# Patient Record
Sex: Female | Born: 1995 | Race: Asian | Hispanic: No | Marital: Single
Health system: Southern US, Community
[De-identification: ages and names within clinical notes are randomized; demographics above are authoritative.]

## PROBLEM LIST (undated history)

## (undated) DIAGNOSIS — R519 Headache, unspecified: Secondary | ICD-10-CM

## (undated) DIAGNOSIS — J45909 Unspecified asthma, uncomplicated: Secondary | ICD-10-CM

## (undated) HISTORY — PX: NO PAST SURGERIES: SHX2092

---
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
## 2017-01-03 ENCOUNTER — Other Ambulatory Visit: Payer: Self-pay

## 2017-06-06 LAB — OB RESULTS CONSOLE GC/CHLAMYDIA
Chlamydia: NEGATIVE
Gonorrhea: NEGATIVE

## 2017-06-06 LAB — OB RESULTS CONSOLE GBS: STREP GROUP B AG: NEGATIVE

## 2017-07-10 ENCOUNTER — Telehealth (HOSPITAL_COMMUNITY): Payer: Self-pay | Admitting: *Deleted

## 2017-07-10 NOTE — Telephone Encounter (Signed)
Preadmission screen  

## 2017-07-12 ENCOUNTER — Encounter (HOSPITAL_COMMUNITY): Payer: Self-pay | Admitting: *Deleted

## 2017-07-12 ENCOUNTER — Inpatient Hospital Stay (HOSPITAL_COMMUNITY)
Admission: AD | Admit: 2017-07-12 | Discharge: 2017-07-12 | Disposition: A | Discharge status: Home / Self Care | Payer: Medicaid Other | Source: Ambulatory Visit | Attending: Obstetrics & Gynecology | Admitting: Obstetrics & Gynecology

## 2017-07-12 ENCOUNTER — Inpatient Hospital Stay (HOSPITAL_COMMUNITY): Payer: Medicaid Other

## 2017-07-12 DIAGNOSIS — O368190 Decreased fetal movements, unspecified trimester, not applicable or unspecified: Secondary | ICD-10-CM

## 2017-07-12 DIAGNOSIS — Z3A41 41 weeks gestation of pregnancy: Secondary | ICD-10-CM

## 2017-07-12 DIAGNOSIS — O480 Post-term pregnancy: Secondary | ICD-10-CM

## 2017-07-12 DIAGNOSIS — O368130 Decreased fetal movements, third trimester, not applicable or unspecified: Secondary | ICD-10-CM

## 2017-07-12 NOTE — MAU Provider Note (Signed)
First Provider Initiated Contact with Patient 07/12/17 2056      Chief Complaint:  Decreased Fetal Movement   Dana Oneal is  21 y.o. G1P0 at 3980w2d presents complaining of crease fetal movement.  Patient states that after her appointment earlier today she no longer felt fetal movement.  States that between 5 PM and 7 PM she monitored kick couns and did not feel any kicks.  States that now she is in the MAU she feels movement.  She states she is having irregular contractions, no vaginal bleeding, no vaginal discharge. Denies leakage of fluid.    Receives prenatal care through the health department  Obstetrical/Gynecological History: OB History    Gravida Para Term Preterm AB Living   1         0   SAB TAB Ectopic Multiple Live Births                 Past Medical History: Past Medical History:  Diagnosis Date  . Asthma     Past Surgical History: Past Surgical History:  Procedure Laterality Date  . NO PAST SURGERIES      Family History: History reviewed. No pertinent family history.  Social History: Social History   Tobacco Use  . Smoking status: Passive Smoke Exposure - Never Smoker  . Smokeless tobacco: Never Used  Substance Use Topics  . Alcohol use: No  . Drug use: No    Allergies: No Known Allergies  Meds:  Medications Prior to Admission  Medication Sig Dispense Refill Last Dose  . ibuprofen (ADVIL,MOTRIN) 400 MG tablet Take 1 tablet (400 mg total) by mouth every 6 (six) hours as needed. (Patient not taking: Reported on 06/29/2016) 30 tablet 0 Not Taking  . Prenatal Vit w/Fe-Methylfol-FA (PNV PO) Take by mouth.       Review of Systems   Constitutional: Negative for fever and chills Eyes: Negative for visual disturbances Respiratory: Negative for shortness of breath, dyspnea Cardiovascular: Negative for chest pain or palpitations  Gastrointestinal: Negative for vomiting, diarrhea and constipation Genitourinary: Negative for dysuria and  urgency Musculoskeletal: Negative for back pain, joint pain, myalgias.  Normal ROM  Neurological: Negative for dizziness and headaches  Physical Exam  Blood pressure 129/78, pulse 77, temperature 97.9 F (36.6 C), resp. rate 18, last menstrual period 09/26/2016. GENERAL: Well-developed, well-nourished female in no acute distress.  LUNGS: Clear to auscultation bilaterally.  HEART: Regular rate and rhythm. ABDOMEN: Soft, nontender, nondistended, gravid.  EXTREMITIES: Nontender, no edema, 2+ distal pulses. DTR's 2+    Labs: No results found for this or any previous visit (from the past 24 hour(s)). Imaging Studies:  BPP 8/8.    I personally reviewed the patient's NST today, found to be REACTIVE. 130 bpm, mod var, +accels, no decels. CTX: irregular  Dr. Doroteo GlassmanPhelps in a CS now, so BPP reviewed and pt given results .Baby active now  Assessment: Dana Oneal is  21 y.o. G1P0 presents with decreased FM, resolved. BPP 8/8 and reviewed with patient.   Plan: DC home ins table condition  CRESENZO-DISHMAN,FRANCES 11/15/201810:54 PM  Caryl AdaJazma Phelps, DO OB Fellow

## 2017-07-12 NOTE — MAU Note (Signed)
Pt reprots she has not felt baby move since earlier this morning. Also c/o tightening on and off in her stomach.

## 2017-07-12 NOTE — Discharge Instructions (Signed)

## 2017-07-13 ENCOUNTER — Encounter (HOSPITAL_COMMUNITY): Payer: Self-pay

## 2017-07-13 ENCOUNTER — Inpatient Hospital Stay (HOSPITAL_COMMUNITY)
Admit: 2017-07-13 | Discharge: 2017-07-17 | DRG: 807 | Disposition: A | Discharge status: Home / Self Care | Payer: Medicaid Other | Source: Ambulatory Visit | Attending: Obstetrics and Gynecology | Admitting: Obstetrics and Gynecology

## 2017-07-13 ENCOUNTER — Telehealth: Payer: Self-pay | Admitting: Advanced Practice Midwife

## 2017-07-13 ENCOUNTER — Telehealth (HOSPITAL_COMMUNITY): Payer: Self-pay | Admitting: General Practice

## 2017-07-13 DIAGNOSIS — R339 Retention of urine, unspecified: Secondary | ICD-10-CM | POA: Diagnosis not present

## 2017-07-13 DIAGNOSIS — O480 Post-term pregnancy: Secondary | ICD-10-CM | POA: Diagnosis present

## 2017-07-13 DIAGNOSIS — Z3A41 41 weeks gestation of pregnancy: Secondary | ICD-10-CM

## 2017-07-13 DIAGNOSIS — Z7722 Contact with and (suspected) exposure to environmental tobacco smoke (acute) (chronic): Secondary | ICD-10-CM | POA: Diagnosis present

## 2017-07-13 DIAGNOSIS — Z3490 Encounter for supervision of normal pregnancy, unspecified, unspecified trimester: Secondary | ICD-10-CM | POA: Diagnosis present

## 2017-07-13 MED ORDER — SOD CITRATE-CITRIC ACID 500-334 MG/5ML PO SOLN: 30.0000 mL | ORAL | Status: DC | PRN

## 2017-07-13 MED ORDER — LACTATED RINGERS IV SOLN
INTRAVENOUS | Status: DC
  Administered 2017-07-13 – 2017-07-14 (×2): via INTRAVENOUS

## 2017-07-13 MED ORDER — OXYTOCIN 40 UNITS IN LACTATED RINGERS INFUSION - SIMPLE MED
2.5000 [IU]/h | INTRAVENOUS | Status: DC
  Filled 2017-07-13: qty 1000

## 2017-07-13 MED ORDER — OXYCODONE-ACETAMINOPHEN 5-325 MG PO TABS: 1.0000 | ORAL_TABLET | ORAL | Status: DC | PRN

## 2017-07-13 MED ORDER — FLEET ENEMA 7-19 GM/118ML RE ENEM: 1.0000 | ENEMA | RECTAL | Status: DC | PRN

## 2017-07-13 MED ORDER — OXYTOCIN BOLUS FROM INFUSION
500.0000 mL | Freq: Once | INTRAVENOUS | Status: AC
  Administered 2017-07-14: 500 mL via INTRAVENOUS

## 2017-07-13 MED ORDER — TERBUTALINE SULFATE 1 MG/ML IJ SOLN
0.2500 mg | Freq: Once | INTRAMUSCULAR | Status: DC | PRN
  Filled 2017-07-13: qty 1

## 2017-07-13 MED ORDER — OXYCODONE-ACETAMINOPHEN 5-325 MG PO TABS: 2.0000 | ORAL_TABLET | ORAL | Status: DC | PRN

## 2017-07-13 MED ORDER — LACTATED RINGERS IV SOLN: 500.0000 mL | INTRAVENOUS | Status: DC | PRN

## 2017-07-13 MED ORDER — LIDOCAINE HCL (PF) 1 % IJ SOLN
30.0000 mL | INTRAMUSCULAR | Status: DC | PRN
  Filled 2017-07-13: qty 30

## 2017-07-13 MED ORDER — MISOPROSTOL 25 MCG QUARTER TABLET
25.0000 ug | ORAL_TABLET | ORAL | Status: DC | PRN
  Administered 2017-07-13: 25 ug via VAGINAL
  Filled 2017-07-13 (×2): qty 1

## 2017-07-13 MED ORDER — ONDANSETRON HCL 4 MG/2ML IJ SOLN
4.0000 mg | Freq: Four times a day (QID) | INTRAMUSCULAR | Status: DC | PRN
  Administered 2017-07-14: 4 mg via INTRAVENOUS
  Filled 2017-07-13: qty 2

## 2017-07-13 MED ORDER — ACETAMINOPHEN 325 MG PO TABS: 650.0000 mg | ORAL_TABLET | ORAL | Status: DC | PRN

## 2017-07-13 NOTE — Telephone Encounter (Signed)
Pt was discharged from MAU yesterday but on chart review, Dr Macon LargeAnyanwu recommends IOL for postdates. Called pt and left message on voicemail for pt to return call or [facility].  IOL added on tonight for [facility] at midnight.

## 2017-07-13 NOTE — H&P (Signed)
LABOR AND DELIVERY ADMISSION HISTORY AND PHYSICAL NOTE  Dana Oneal is a 21 y.o. female G1P0 with IUP at 7257w3d by 1st trimester US presenting for IOL for post date.   She reports positive fetal movement. She denies leakage of fluid or vaginal bleeding.  Pren[facility] History/Complications[facility] Pregnancy complications: Anemia, varicella non immune  Past Medical History: Past Medical History:  Diagnosis Date  . Asthma    last inhaler use "in middle school"    Past Surgical History: Past Surgical History:  Procedure Laterality Date  . NO PAST SURGERIES      Obstetrical History: OB History    Gravida Para Term Preterm AB Living   1         0   SAB TAB Ectopic Multiple Live Births                  Social History: Social History   Socioeconomic History  . Marital status: Single    Spouse name: None  . Number of children: None  . Years of education: None  . Highest education level: None  Social Needs  . Financial resource strain: None  . Food insecurity - worry: None  . Food insecurity - inability: None  . Transportation needs - medical: None  . Transportation needs - non-medical: None  Occupational History  . None  Tobacco Use  . Smoking status: Passive Smoke Exposure - Never Smoker  . Smokeless tobacco: Never Used  Substance and Sexual Activity  . Alcohol use: No  . Drug use: No  . Sexual activity: Yes    Birth control/protection: None  Other Topics Concern  . None  Social History Narrative  . None    Family History: History reviewed. No pertinent family history.  Allergies: No Known Allergies  Medications Prior to Admission  Medication Sig Dispense Refill Last Dose  . Prenatal Vit w/Fe-Methylfol-FA (PNV PO) Take by mouth.   07/12/2017 at Unknown time     Review of Systems  All systems reviewed and negative except as stated in HPI  Physical Exam Blood pressure 124/81, pulse (!) 115, temperature 97.7 F (36.5 C), temperature source Oral,  resp. rate 18, height 5' 1.5" (1.562 m), weight 146 lb (66.2 kg), last menstrual period 09/26/2016. General appearance: alert, cooperative and no distress Lungs: clear to auscultation bilaterally Heart: regular rate and rhythm Abdomen: soft, non-tender; bowel sounds normal Extremities: No calf swelling or tenderness Presentation: cephalic on exam  Fetal monitoring: HR 140, mod variability, + accels, no decels  Uterine activity: Not tracing contractions  SVE: 1.5cm/30%/-3  Prenatal labs: ABO, Rh: O/Positive/-- (05/03 0000) Antibody: Negative (05/03 0000) Rubella: Immune (05/03 0000) RPR: Nonreactive (05/03 0000)  HBsAg: Negative (05/03 0000)  HIV: Non-reactive (05/03 0000)  GBS: Negative (10/10 0000)  1 hr Glucola: 106 Genetic screening:  Negative Anatomy US: Normal anatomy per prenatal record  Prenatal Transfer Tool  Maternal Diabetes: No Genetic Screening: Normal Maternal Ultrasounds/Referrals: Normal Fetal Ultrasounds or other Referrals:  None Maternal Substance Abuse:  No Significant Maternal Medications:  None Significant Maternal Lab Results: Lab values include: Group B Strep negative, Other: Varicella non immune  No results found for this or any previous visit (from the past 24 hour(s)).  Patient Active Problem List   Diagnosis Date Noted  . Encounter for induction of labor 07/13/2017    Assessment: Dana Oneal is a 21 y.o. G1P0 at 6257w3d here for IOL for post date.  #Labor: Cytotec and Foley bulb placed @ 12:00 #  Pain: Likely IV pain meds, epidural per patient #FWB: Cat 1 #ID:  GBS neg  #MOF: Both #MOC: No birth control #Circ:  N/A  Abdoulaye Diallo 07/13/2017, 11:20 PM   OB FELLOW HISTORY AND PHYSICAL ATTESTATION I have seen and examined this patient; I agree with above documentation in the resident's note.  --PDIOL. Start cervical ripening with Cytotec and FB --GBS neg --FHT Cat I   Frederik PearJulie P Degele, MD OB Fellow 07/14/2017, 1:59 AM

## 2017-07-14 ENCOUNTER — Encounter (HOSPITAL_COMMUNITY): Payer: Self-pay | Admitting: Anesthesiology

## 2017-07-14 ENCOUNTER — Inpatient Hospital Stay (HOSPITAL_COMMUNITY): Payer: Medicaid Other | Admitting: Anesthesiology

## 2017-07-14 ENCOUNTER — Encounter (HOSPITAL_COMMUNITY): Payer: Self-pay

## 2017-07-14 DIAGNOSIS — O480 Post-term pregnancy: Secondary | ICD-10-CM

## 2017-07-14 DIAGNOSIS — Z3A41 41 weeks gestation of pregnancy: Secondary | ICD-10-CM

## 2017-07-14 LAB — TYPE AND SCREEN
ABO/RH(D): O POS
Antibody Screen: NEGATIVE

## 2017-07-14 LAB — CBC
HEMATOCRIT: 29.5 % — AB (ref 36.0–46.0)
HEMOGLOBIN: 9.5 g/dL — AB (ref 12.0–15.0)
MCH: 23.7 pg — ABNORMAL LOW (ref 26.0–34.0)
MCHC: 32.2 g/dL (ref 30.0–36.0)
MCV: 73.6 fL — AB (ref 78.0–100.0)
Platelets: 224 10*3/uL (ref 150–400)
RBC: 4.01 MIL/uL (ref 3.87–5.11)
RDW: 15.9 % — ABNORMAL HIGH (ref 11.5–15.5)
WBC: 8.4 10*3/uL (ref 4.0–10.5)

## 2017-07-14 LAB — RPR: RPR Ser Ql: NONREACTIVE

## 2017-07-14 LAB — ABO/RH: ABO/RH(D): O POS

## 2017-07-14 MED ORDER — SIMETHICONE 80 MG PO CHEW: 80.0000 mg | CHEWABLE_TABLET | ORAL | Status: DC | PRN

## 2017-07-14 MED ORDER — EPHEDRINE 5 MG/ML INJ
10.0000 mg | INTRAVENOUS | Status: DC | PRN
  Filled 2017-07-14: qty 2

## 2017-07-14 MED ORDER — IBUPROFEN 600 MG PO TABS
600.0000 mg | ORAL_TABLET | Freq: Four times a day (QID) | ORAL | Status: DC
  Administered 2017-07-14 – 2017-07-17 (×10): 600 mg via ORAL
  Filled 2017-07-14 (×10): qty 1

## 2017-07-14 MED ORDER — PHENYLEPHRINE 40 MCG/ML (10ML) SYRINGE FOR IV PUSH (FOR BLOOD PRESSURE SUPPORT)
80.0000 ug | PREFILLED_SYRINGE | INTRAVENOUS | Status: DC | PRN
  Filled 2017-07-14: qty 5

## 2017-07-14 MED ORDER — COCONUT OIL OIL: 1.0000 "application " | TOPICAL_OIL | Status: DC | PRN

## 2017-07-14 MED ORDER — ZOLPIDEM TARTRATE 5 MG PO TABS: 5.0000 mg | ORAL_TABLET | Freq: Every evening | ORAL | Status: DC | PRN

## 2017-07-14 MED ORDER — DIBUCAINE 1 % RE OINT: 1.0000 "application " | TOPICAL_OINTMENT | RECTAL | Status: DC | PRN

## 2017-07-14 MED ORDER — BENZOCAINE-MENTHOL 20-0.5 % EX AERO
1.0000 "application " | INHALATION_SPRAY | CUTANEOUS | Status: DC | PRN
  Administered 2017-07-14 – 2017-07-17 (×2): 1 via TOPICAL
  Filled 2017-07-14 (×2): qty 56

## 2017-07-14 MED ORDER — ACETAMINOPHEN 325 MG PO TABS
650.0000 mg | ORAL_TABLET | ORAL | Status: DC | PRN
  Administered 2017-07-14 – 2017-07-15 (×2): 650 mg via ORAL
  Filled 2017-07-14 (×2): qty 2

## 2017-07-14 MED ORDER — DIPHENHYDRAMINE HCL 25 MG PO CAPS: 25.0000 mg | ORAL_CAPSULE | Freq: Four times a day (QID) | ORAL | Status: DC | PRN

## 2017-07-14 MED ORDER — LIDOCAINE HCL (PF) 1 % IJ SOLN
INTRAMUSCULAR | Status: DC | PRN
  Administered 2017-07-14: 4 mL via EPIDURAL

## 2017-07-14 MED ORDER — PHENYLEPHRINE 40 MCG/ML (10ML) SYRINGE FOR IV PUSH (FOR BLOOD PRESSURE SUPPORT)
PREFILLED_SYRINGE | INTRAVENOUS | Status: AC
  Filled 2017-07-14: qty 10

## 2017-07-14 MED ORDER — LACTATED RINGERS IV SOLN
500.0000 mL | Freq: Once | INTRAVENOUS | Status: AC
  Administered 2017-07-14: 500 mL via INTRAVENOUS

## 2017-07-14 MED ORDER — PRENATAL MULTIVITAMIN CH
1.0000 | ORAL_TABLET | Freq: Every day | ORAL | Status: DC
  Administered 2017-07-15 – 2017-07-16 (×2): 1 via ORAL
  Filled 2017-07-14 (×2): qty 1

## 2017-07-14 MED ORDER — ONDANSETRON HCL 4 MG PO TABS: 4.0000 mg | ORAL_TABLET | ORAL | Status: DC | PRN

## 2017-07-14 MED ORDER — FENTANYL 2.5 MCG/ML BUPIVACAINE 1/10 % EPIDURAL INFUSION (WH - ANES)
14.0000 mL/h | INTRAMUSCULAR | Status: DC | PRN
  Administered 2017-07-14: 12 mL/h via EPIDURAL
  Administered 2017-07-14: 14 mL/h via EPIDURAL
  Filled 2017-07-14: qty 100

## 2017-07-14 MED ORDER — TETANUS-DIPHTH-ACELL PERTUSSIS 5-2.5-18.5 LF-MCG/0.5 IM SUSP: 0.5000 mL | Freq: Once | INTRAMUSCULAR | Status: DC

## 2017-07-14 MED ORDER — ONDANSETRON HCL 4 MG/2ML IJ SOLN: 4.0000 mg | INTRAMUSCULAR | Status: DC | PRN

## 2017-07-14 MED ORDER — FENTANYL CITRATE (PF) 100 MCG/2ML IJ SOLN
100.0000 ug | INTRAMUSCULAR | Status: DC | PRN
  Administered 2017-07-14 (×2): 100 ug via INTRAVENOUS
  Filled 2017-07-14 (×2): qty 2

## 2017-07-14 MED ORDER — TERBUTALINE SULFATE 1 MG/ML IJ SOLN
0.2500 mg | Freq: Once | INTRAMUSCULAR | Status: DC | PRN
  Filled 2017-07-14: qty 1

## 2017-07-14 MED ORDER — LACTATED RINGERS IV SOLN: 500.0000 mL | Freq: Once | INTRAVENOUS | Status: DC

## 2017-07-14 MED ORDER — OXYTOCIN 40 UNITS IN LACTATED RINGERS INFUSION - SIMPLE MED
1.0000 m[IU]/min | INTRAVENOUS | Status: DC
  Administered 2017-07-14: 2 m[IU]/min via INTRAVENOUS

## 2017-07-14 MED ORDER — DIPHENHYDRAMINE HCL 50 MG/ML IJ SOLN: 12.5000 mg | INTRAMUSCULAR | Status: DC | PRN

## 2017-07-14 MED ORDER — WITCH HAZEL-GLYCERIN EX PADS: 1.0000 "application " | MEDICATED_PAD | CUTANEOUS | Status: DC | PRN

## 2017-07-14 MED ORDER — FENTANYL 2.5 MCG/ML BUPIVACAINE 1/10 % EPIDURAL INFUSION (WH - ANES)
INTRAMUSCULAR | Status: AC
  Filled 2017-07-14: qty 100

## 2017-07-14 MED ORDER — SENNOSIDES-DOCUSATE SODIUM 8.6-50 MG PO TABS
2.0000 | ORAL_TABLET | ORAL | Status: DC
  Administered 2017-07-14 – 2017-07-16 (×3): 2 via ORAL
  Filled 2017-07-14 (×3): qty 2

## 2017-07-14 NOTE — Anesthesia Preprocedure Evaluation (Addendum)
Anesthesia Evaluation  Patient identified by MRN, date of birth, ID band Patient awake    Reviewed: Allergy & Precautions, Patient's Chart, lab work & pertinent test results  Airway Mallampati: I  TM Distance: >3 FB Neck ROM: Full    Dental   Pulmonary asthma ,    breath sounds clear to auscultation       Cardiovascular negative cardio ROS   Rhythm:Regular Rate:Normal     Neuro/Psych negative neurological ROS     GI/Hepatic negative GI ROS, Neg liver ROS,   Endo/Other  negative endocrine ROS  Renal/GU negative Renal ROS     Musculoskeletal negative musculoskeletal ROS (+)   Abdominal   Peds  Hematology negative hematology ROS (+)   Anesthesia Other Findings Day of surgery medications reviewed with the patient.  Reproductive/Obstetrics (+) Pregnancy                             Lab Results  Component Value Date   WBC 8.4 07/13/2017   HGB 9.5 (L) 07/13/2017   HCT 29.5 (L) 07/13/2017   MCV 73.6 (L) 07/13/2017   PLT 224 07/13/2017     Anesthesia Physical Anesthesia Plan  ASA: II  Anesthesia Plan: Epidural   Post-op Pain Management:    Induction:   PONV Risk Score and Plan:   Airway Management Planned:   Additional Equipment:   Intra-op Plan:   Post-operative Plan:   Informed Consent: I have reviewed the patients History and Physical, chart, labs and discussed the procedure including the risks, benefits and alternatives for the proposed anesthesia with the patient or authorized representative who has indicated his/her understanding and acceptance.     Plan Discussed with:   Anesthesia Plan Comments:         Anesthesia Quick Evaluation

## 2017-07-14 NOTE — Progress Notes (Signed)
Dana Oneal is a 21 y.o. G1P0 at 3131w4d.  Subjective: AROM @1630   Objective: BP 131/73   Pulse 75   Temp 98.3 F (36.8 C) (Oral)   Resp 18   Ht 5' 1.5" (1.562 m)   Wt 146 lb (66.2 kg)   LMP 09/26/2016   BMI 27.14 kg/m    FHT:  FHR: 145 bpm, variability: minimal,  accelerations:  n/a,  decelerations: minmal UC:   Q 2-223minutes,  Dilation: 10 Dilation Complete Date: 07/14/17 Dilation Complete Time: 1625 Effacement (%): 100 Station: 0 Presentation: Vertex Exam by:: Mary Early  Labs: Results for orders placed or performed during the hospital encounter of 07/13/17 (from the past 24 hour(s))  CBC     Status: Abnormal   Collection Time: 07/13/17 11:30 PM  Result Value Ref Range   WBC 8.4 4.0 - 10.5 K/uL   RBC 4.01 3.87 - 5.11 MIL/uL   Hemoglobin 9.5 (L) 12.0 - 15.0 g/dL   HCT 82.929.5 (L) 56.236.0 - 13.046.0 %   MCV 73.6 (L) 78.0 - 100.0 fL   MCH 23.7 (L) 26.0 - 34.0 pg   MCHC 32.2 30.0 - 36.0 g/dL   RDW 86.515.9 (H) 78.411.5 - 69.615.5 %   Platelets 224 150 - 400 K/uL  Type and screen [facility]     Status: None   Collection Time: 07/13/17 11:30 PM  Result Value Ref Range   ABO/RH(D) O POS    Antibody Screen NEG    Sample Expiration 07/16/2017   ABO/Rh     Status: None   Collection Time: 07/13/17 11:30 PM  Result Value Ref Range   ABO/RH(D) O POS     Assessment / Plan: 4631w4d week IUP Labor: active Fetal Wellbeing:  Category 1 Pain Control:  Well controlled Anticipated MOD:  SVD  Dana RollingBland, Dana Leiter, DO 07/14/2017 4:35 PM

## 2017-07-14 NOTE — Anesthesia Procedure Notes (Signed)
Epidural Patient location during procedure: OB Start time: 07/14/2017 8:08 AM End time: 07/14/2017 8:14 AM  Staffing Anesthesiologist: Shelton SilvasHollis, Kevin D, MD Performed: anesthesiologist   Preanesthetic Checklist Completed: patient identified, site marked, surgical consent, pre-op evaluation, timeout performed, IV checked, risks and benefits discussed and monitors and equipment checked  Epidural Patient position: sitting Prep: ChloraPrep Patient monitoring: heart rate, continuous pulse ox and blood pressure Approach: midline Location: L3-L4 Injection technique: LOR saline  Needle:  Needle type: Tuohy  Needle gauge: 17 G Needle length: 9 cm Catheter type: closed end flexible Catheter size: 20 Guage Test dose: negative and 1.5% lidocaine  Assessment Events: blood not aspirated, injection not painful, no injection resistance and no paresthesia  Additional Notes LOR @ 4  Patient identified. Risks/Benefits/Options discussed with patient including but not limited to bleeding, infection, nerve damage, paralysis, failed block, incomplete pain control, headache, blood pressure changes, nausea, vomiting, reactions to medications, itching and postpartum back pain. Confirmed with bedside nurse the patient's most recent platelet count. Confirmed with patient that they are not currently taking any anticoagulation, have any bleeding history or any family history of bleeding disorders. Patient expressed understanding and wished to proceed. All questions were answered. Sterile technique was used throughout the entire procedure. Please see nursing notes for vital signs. Test dose was given through epidural catheter and negative prior to continuing to dose epidural or start infusion. Warning signs of high block given to the patient including shortness of breath, tingling/numbness in hands, complete motor block, or any concerning symptoms with instructions to call for help. Patient was given instructions  on fall risk and not to get out of bed. All questions and concerns addressed with instructions to call with any issues or inadequate analgesia.    Reason for block:procedure for pain

## 2017-07-14 NOTE — Anesthesia Pain Management Evaluation Note (Signed)
  [facility] Pain Management Visit Note  Patient: Dana Oneal, 21 y.o., female  "Hello I am a [facility] anesthesia [facility]. We have an anesthesia team available at all times to provide care throughout the hospital, including epidural management and anesthesia for C-section. I don't know your plan for the delivery whether it a natural birth, water birth, IV sedation, nitrous supplementation, doula or epidural, but we want to meet your pain goals."   1.Was your pain managed to your expectations on prior hospitalizations?   No prior hospitalizations  2.What is your expectation for pain management during this hospitalization?     Epidural  3.How can we help you reach that goal? epidural  Record the patient's initial score and the patient's pain goal.   Pain: 0  Pain Goal: 4 [facility] wants you to be able to say your pain was always managed very well.  Dana Oneal 07/14/2017

## 2017-07-15 ENCOUNTER — Other Ambulatory Visit: Payer: Self-pay

## 2017-07-15 LAB — CBC
HEMATOCRIT: 22.7 % — AB (ref 36.0–46.0)
HEMOGLOBIN: 7.2 g/dL — AB (ref 12.0–15.0)
MCH: 23.9 pg — AB (ref 26.0–34.0)
MCHC: 31.7 g/dL (ref 30.0–36.0)
MCV: 75.4 fL — ABNORMAL LOW (ref 78.0–100.0)
Platelets: 152 10*3/uL (ref 150–400)
RBC: 3.01 MIL/uL — ABNORMAL LOW (ref 3.87–5.11)
RDW: 15.8 % — ABNORMAL HIGH (ref 11.5–15.5)
WBC: 20.7 10*3/uL — ABNORMAL HIGH (ref 4.0–10.5)

## 2017-07-15 MED ORDER — OXYCODONE-ACETAMINOPHEN 5-325 MG PO TABS
1.0000 | ORAL_TABLET | ORAL | Status: DC | PRN
  Administered 2017-07-15: 1 via ORAL
  Filled 2017-07-15: qty 1

## 2017-07-15 MED ORDER — OXYCODONE-ACETAMINOPHEN 5-325 MG PO TABS
1.0000 | ORAL_TABLET | Freq: Once | ORAL | Status: AC
  Administered 2017-07-15: 1 via ORAL
  Filled 2017-07-15: qty 1

## 2017-07-15 NOTE — Progress Notes (Signed)
Pt expressed desire to exclusively bottle feed baby. She understand LEAD teaching and stands firm on her decision.

## 2017-07-15 NOTE — Anesthesia Postprocedure Evaluation (Signed)
Anesthesia Post Note  Patient: Dana Oneal  Procedure(s) Performed: AN AD HOC LABOR EPIDURAL     Patient location during evaluation: Mother Baby Anesthesia Type: Epidural Level of consciousness: awake and alert and oriented Pain management: satisfactory to patient Vital Signs Assessment: post-procedure vital signs reviewed and stable Respiratory status: spontaneous breathing and nonlabored ventilation Cardiovascular status: stable Postop Assessment: no headache, no backache, no signs of nausea or vomiting, adequate PO intake and patient able to bend at knees (patient up walking) Anesthetic complications: no    Last Vitals:  Vitals:   07/15/17 0552 07/15/17 0825  BP: (!) 101/50 (!) 91/51  Pulse: 76 75  Resp: 18   Temp: 36.7 C 36.6 C  SpO2:  99%    Last Pain:  Vitals:   07/15/17 0825  TempSrc: Oral  PainSc:    Pain Goal:                 Dana Oneal,Dana Oneal

## 2017-07-15 NOTE — Progress Notes (Signed)
Post Partum Day 1 Subjective: no complaints, up ad lib, tolerating PO and Plan to leave foley in for 24 hours  Objective: Blood pressure (!) 101/50, pulse 76, temperature 98.1 F (36.7 C), temperature source Oral, resp. rate 18, height 5' 1.5" (1.562 m), weight 146 lb (66.2 kg), last menstrual period 09/26/2016, SpO2 99 %, unknown if currently breastfeeding.  Physical Exam:  General: alert, cooperative and no distress Lochia: appropriate Uterine Fundus: firm Incision: healing well DVT Evaluation: No evidence of DVT seen on physical exam.  Recent Labs    07/13/17 2330  HGB 9.5*  HCT 29.5*    Assessment/Plan: Plan for discharge tomorrow and Breastfeeding   LOS: 2 days   Wynelle BourgeoisMarie Williams 07/15/2017, 6:02 AM

## 2017-07-15 NOTE — Lactation Note (Signed)
This note was copied from a baby's chart. Lactation Consultation Note Baby is 3313 hrs old. Mom is breast and formula feeding. Mom stated she didn't have any milk. Hand expression taught w/no colostrum noted. Mom has everted "button"nipples.  Newborn behavior discussed, I&O, STS, cluster feeding, supply and demand. Mom stated she is going to be breast/formula when she goes home. Gave mom hand pump, demonstrated. Encouraged to use for extra stimulation. Educated LEAD w/mom and reminded of formula information sheet. Encouraged BF first, if feels the need to formula feed after BF. Mom encouraged to feed baby 8-12 times/24 hours and with feeding cues. Encourage to call for assistance.  [facility]/LC brochure given w/resources, support groups and LC services.  Patient Name: Dana Oneal'UToday's Date: 07/15/2017 Reason for consult: Initial assessment   Maternal Data Has patient been taught Hand Expression?: Yes  Feeding Feeding Type: Bottle Fed - Formula  LATCH Score       Type of Nipple: Everted at rest and after stimulation  Comfort (Breast/Nipple): Soft / non-tender        Interventions Interventions: Breast feeding basics reviewed;Hand pump;Hand express  Lactation Tools Discussed/Used Tools: Pump Breast pump type: Manual WIC Program: Yes Pump Review: Setup, frequency, and cleaning;Milk Storage Initiated by:: Peri JeffersonL. Nohemi Nicklaus RN IBCLC Date initiated:: 07/15/17   Consult Status Consult Status: Follow-up Date: 07/16/17 Follow-up type: In-patient    Darrek Leasure, Diamond NickelLAURA G 07/15/2017, 6:59 AM

## 2017-07-16 ENCOUNTER — Inpatient Hospital Stay (HOSPITAL_COMMUNITY): Admission: RE | Admit: 2017-07-16 | Payer: Medicaid Other | Source: Ambulatory Visit

## 2017-07-16 NOTE — Progress Notes (Signed)
POSTPARTUM PROGRESS NOTE  Post Partum Day 2 Subjective:  Vonda Antiguahoi Czajkowski is a 21 y.o. G1P1001 3418w4d s/p VAVD.  No acute events overnight.  Pt denies problems with ambulating, or po intake.  She does describe dysuria, difficulty starting and reduced urine amounts.  She denies nausea or vomiting.  Pain is well controlled.  She has had flatus. She has had bowel movement.  Lochia Minimal.   Objective: Blood pressure (!) 97/51, pulse 70, temperature 98 F (36.7 C), temperature source Oral, resp. rate 18, height 5' 1.5" (1.562 m), weight 146 lb (66.2 kg), last menstrual period 09/26/2016, SpO2 99 %, unknown if currently breastfeeding.  Physical Exam:  General: alert, cooperative and no distress Lochia:normal flow Chest: no respiratory distress Heart:regular rate, distal pulses intact Abdomen: soft, nontender,  Uterine Fundus: firm, appropriately tender DVT Evaluation: No calf swelling or tenderness Extremities: minimal bilateral edema  Recent Labs    07/13/17 2330 07/15/17 0516  HGB 9.5* 7.2*  HCT 29.5* 22.7*    Assessment/Plan:  ASSESSMENT: Vonda Antiguahoi Godbee is a 21 y.o. G1P1001 7218w4d s/p VAVD c/o of urinary retention as her only unexpected symptom at this time  Post void bladder scan shows 300cc of urine retained. Will in and out cath patient to measure residual. Encouraged patient to increase fluid intake and go to bathroom frequently to attempt to empty bladder. Will continue to monitor progress throughout the day.   Plan for discharge tomorrow pending resolution of urinary retention   LOS: 3 days   Scott BlandDO 07/16/2017, 10:09 AM

## 2017-07-16 NOTE — Lactation Note (Signed)
This note was copied from a baby's chart. Lactation Consultation Note  Patient Name: Dana Vonda Antiguahoi Schatz ONGEX'BToday's Date: 07/16/2017 Reason for consult: Follow-up assessment;Other (Comment)(breast/ formula )  Baby is 39 hours,  Per mom no milk yet. Plans to breast feed.  LC reviewed supply an demand, also explained since the baby has had several bottles  May need to give a small appetizer of formula 1st so the baby is going to the breast calm.  Also prior to latching - breast massage, hand express, pre-pump with the hand pump to prime  The milk ducts, and latch the baby, offer both breast and then supplement.  Mother informed of post-discharge support and given phone number to the lactation department, including services for phone call assistance; out-patient appointments; and breastfeeding support group. List of other breastfeeding resources in the community given in the handout. Encouraged mother to call for problems or concerns related to breastfeeding.  LC unable top see a latch this visit due to the baby just having 45 ml and being sound asleep after Dr. Ezequiel EssexGable examined.     Maternal Data Has patient been taught Hand Expression?: Yes(per mom familiar )  Feeding Feeding Type: (recently had fed ) Nipple Type: Slow - flow  LATCH Score                   Interventions Interventions: Breast feeding basics reviewed  Lactation Tools Discussed/Used Tools: Pump Breast pump type: Manual   Consult Status Consult Status: Complete Date: 07/16/17    Dana Oneal 07/16/2017, 9:13 AM

## 2017-07-17 MED ORDER — IBUPROFEN 600 MG PO TABS: 600.0000 mg | ORAL_TABLET | Freq: Four times a day (QID) | ORAL | 0 refills | Status: DC

## 2017-07-17 NOTE — Discharge Summary (Signed)
OB Discharge Summary     Patient Name: Dana Oneal DOB: 09/24/1995 MRN: [id number] Date of admission: 07/13/2017  Delivering MD: [place] BingPICKENS, CHARLIE )  Date of discharge: 07/17/2017    Admitting diagnosis: post dates Intrauterine pregnancy: 1463w4d    Secondary diagnosis:  Active Problems:   Patient Active Problem List   Diagnosis Date Noted  . Encounter for induction of labor 07/13/2017    Additional problems: none     Discharge diagnosis: Term Pregnancy Delivered                                                                                                Post partum procedures:none  Complications: None  Hospital course:  Induction of Labor With Vaginal Delivery   21 y.o. yo G1P1001 at 863w4d was admitted to the hospital 07/13/2017 for induction of labor.  Indication for induction: Postdates.  Patient had an uncomplicated labor course as follows: Membrane Rupture Time/Date: 4:30 PM ,07/14/2017   Intrapartum Procedures: Episiotomy: None [1]                                         Lacerations:  2nd degree [3];Perineal [11]  Patient had delivery of a Viable infant.  Information for the patient's newborn:  Dana Oneal, Girl Dana Oneal [[id number]][[id number]]  Delivery Method: Vag-Spont   07/14/2017  Details of delivery can be found in separate delivery note.  Patient had a routine postpartum course. Patient is discharged home 07/17/17.  Physical exam  Vitals:   07/16/17 1815 07/17/17 0555  BP: (!) 105/48 (!) 105/50  Pulse: 75 69  Resp: 17 18  Temp: 97.8 F (36.6 C) 97.8 F (36.6 C)  SpO2:      General: alert, cooperative and no distress Lochia: appropriate Uterine Fundus: firm Incision: N/A DVT Evaluation: No evidence of DVT seen on physical exam.  Labs: No results found for this or any previous visit (from the past 24 hour(s)).   Discharge instruction: per After Visit Summary and "Baby and Me Booklet".  After visit meds:  No Known Allergies  Allergies as of 07/17/2017   No Known  Allergies     Medication List    TAKE these medications   ibuprofen 600 MG tablet Commonly known as:  ADVIL,MOTRIN Take 1 tablet (600 mg total) every 6 (six) hours by mouth.   PNV PO Take 1 tablet daily by mouth.        Diet: routine diet  Activity: Advance as tolerated. Pelvic rest for 6 weeks.   Outpatient follow up:4 weeks Future Appointments: No future appointments.  Follow up Appt: No Follow-up on file.     Postpartum contraception: None  Newborn Data: APGAR (1 MIN): 8   APGAR (5 MINS): 9      Baby Feeding: Bottle and Breast Disposition:home with mother  Dana Bookbindermber Dontavia Brand, DO  07/17/2017

## 2017-07-25 ENCOUNTER — Other Ambulatory Visit: Payer: Self-pay | Admitting: Family Medicine

## 2017-07-26 ENCOUNTER — Telehealth: Payer: Self-pay | Admitting: General Practice

## 2017-07-26 NOTE — Telephone Encounter (Signed)
Patient's boyfriend called and left message on her behalf stating they are calling regarding a patient. Per chart review, patient went to [facility] for prenatal care. Called patient, no answer- left message stating we are trying to return your phone call. You may also contact your doctor at the health department if you have questions regarding a refill and need anything

## 2017-07-26 NOTE — Telephone Encounter (Signed)
Ladonna SnideShonda from [facility] called and left message on nurse line stating she went into the patient's home today for follow up and performed an edinburgh scale on her even though it was 2 days early for that. Ladonna SnideShonda states patient scored a 14 and reports feeling overwhelmed but answered no to the harming herself question. Patient is otherwise doing well. Patient delivered on 11/17. Will forward message to Asher MuirJamie, behavorial health clinician.

## 2017-08-13 NOTE — Telephone Encounter (Signed)
Second f/u call to pt; voicemail not set up, no message left.

## 2017-08-15 NOTE — Telephone Encounter (Signed)
Third f/u call to pt; voicemail not set up, no message left.

## 2017-08-24 ENCOUNTER — Encounter: Payer: Self-pay | Admitting: Certified Nurse Midwife

## 2017-08-24 ENCOUNTER — Ambulatory Visit (INDEPENDENT_AMBULATORY_CARE_PROVIDER_SITE_OTHER): Payer: Medicaid Other | Admitting: Certified Nurse Midwife

## 2017-08-24 ENCOUNTER — Other Ambulatory Visit (HOSPITAL_COMMUNITY)
Admission: RE | Admit: 2017-08-24 | Discharge: 2017-08-24 | Disposition: A | Discharge status: Home / Self Care | Payer: Medicaid Other | Source: Ambulatory Visit | Attending: Certified Nurse Midwife | Admitting: Certified Nurse Midwife

## 2017-08-24 DIAGNOSIS — N898 Other specified noninflammatory disorders of vagina: Secondary | ICD-10-CM | POA: Diagnosis not present

## 2017-08-24 DIAGNOSIS — Z3009 Encounter for other general counseling and advice on contraception: Secondary | ICD-10-CM

## 2017-08-24 NOTE — Progress Notes (Signed)
Subjective:     Dana Oneal is a 21 y.o. female who presents for a postpartum visit. She is 6 weeks postpartum following a spontaneous vaginal delivery. I have fully reviewed the prenatal and intrapartum course. The delivery was at 5715w4d gestational weeks. Outcome: vacuum assisted vaginal delivery. Anesthesia: epidural. Postpartum course has been complicated by vaginal discharge- she reports discharge is a greenish yellow color, thin with no odor. Reports it has been present for the last week. She denies intercourse. Baby's course has been uncomplicated. Baby is feeding by both breast and bottle - Similac Neosure. Bleeding no bleeding now but thinks she got her period last week. Bowel function is normal. Bladder function is normal. Contraception method is none. Postpartum depression screening: positive.  The following portions of the patient's history were reviewed and updated as appropriate: allergies, current medications, past family history, past medical history, past social history, past surgical history and problem list.  Review of Systems Pertinent items noted in HPI and remainder of comprehensive ROS otherwise negative.   Objective:    LMP 09/26/2016   General:  alert, cooperative and no distress   Breasts:  inspection negative, no nipple discharge or bleeding, no masses or nodularity palpable  Lungs: clear to auscultation bilaterally  Heart:  regular rate and rhythm, S1, S2 normal, no murmur, click, rub or gallop  Abdomen: soft, non-tender; bowel sounds normal; no masses,  no organomegaly   Vulva:  normal  Vagina: vagina positive for dark yellow discharge . 2nd degree laceration healed- slightly tender with touch to area. No redness or edema present   Cervix:  no cervical motion tenderness  Corpus: normal size, contour, position, consistency, mobility, non-tender  Adnexa:  normal adnexa  Rectal Exam: Not performed.        Assessment/Plan:   1. Encounter for routine postpartum  follow-up -Normal postpartum exam. PAP smear not done at today's visit- patient <21yo  -Contraception: None, uses condoms sometimes   2. Encounter for other general counseling or advice on contraception -Educated on use of birth control to prevent pregnancy. Discussed options for contraception and given list of information. Patient does not want birth control at this time but will return to get birth control if she changes her mind.   3. Vaginal discharge - Cervicovaginal ancillary only   Follow up with Asher MuirJamie as soon as appointment is available for positive PP depression screening  Will call patient with positive results from cervicovaginal ancillary swab    Sharyon CableVeronica C Del Overfelt, CNM 08/24/17, 4:09 PM

## 2017-08-24 NOTE — Patient Instructions (Signed)

## 2017-08-29 ENCOUNTER — Telehealth: Payer: Self-pay | Admitting: *Deleted

## 2017-08-29 LAB — CERVICOVAGINAL ANCILLARY ONLY
Bacterial vaginitis: NEGATIVE
Candida vaginitis: NEGATIVE
Chlamydia: NEGATIVE
Neisseria Gonorrhea: NEGATIVE
Trichomonas: NEGATIVE

## 2017-08-29 NOTE — Telephone Encounter (Signed)
Notified by cytology unable to do hpv testing on gc  Probe. Can only be done on thin prep pap vial or blood.  Will message provider to see if need recollect.

## 2017-10-17 IMAGING — US US TRANSVAGINAL NON-OB
1 series · 14 of 25 positions shown · non-contrast
Comparison: None

CLINICAL DATA: 20 y/o  F; 5 days and pelvic pain.

EXAM:
TRANSABDOMINAL AND TRANSVAGINAL ULTRASOUND OF PELVIS
TECHNIQUE: Both transabdominal and transvaginal ultrasound examinations of the
pelvis were performed. Transabdominal technique was performed for
global imaging of the pelvis including uterus, ovaries, adnexal
regions, and pelvic cul-de-sac. It was necessary to proceed with
endovaginal exam following the transabdominal exam to visualize the
bilateral ovaries.

[Series 1: us transvaginal non-ob · 0.21mm/px · 14 of 90 slices shown]
[im 1/90]
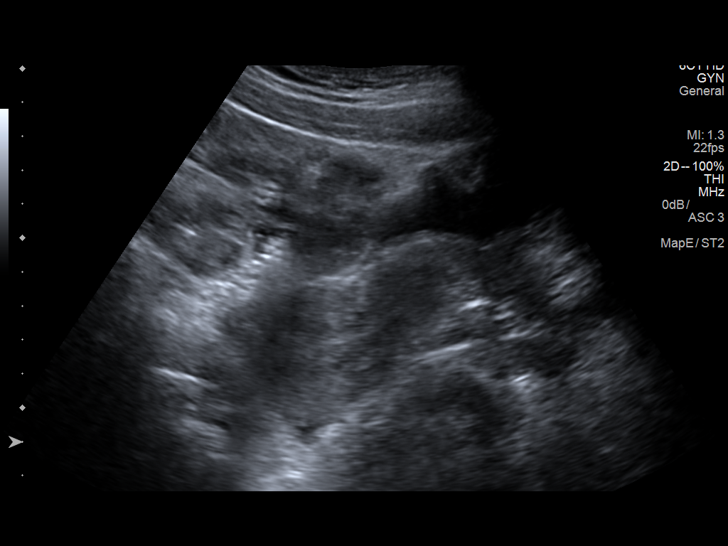
[im 8/90]
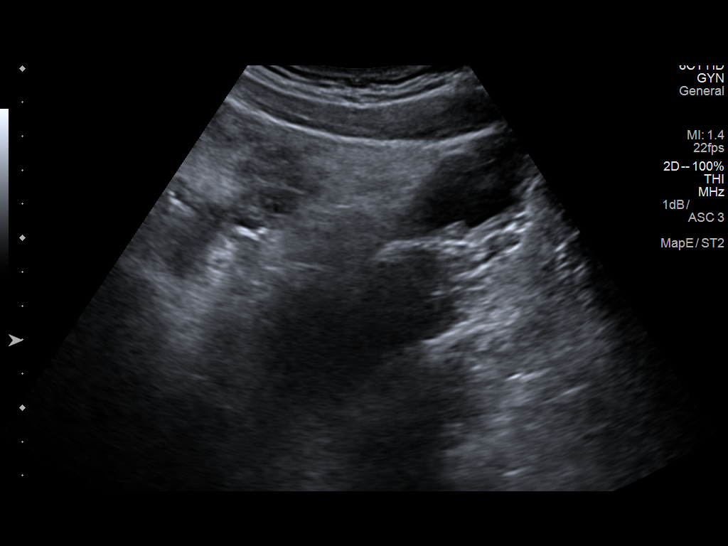
[im 15/90]
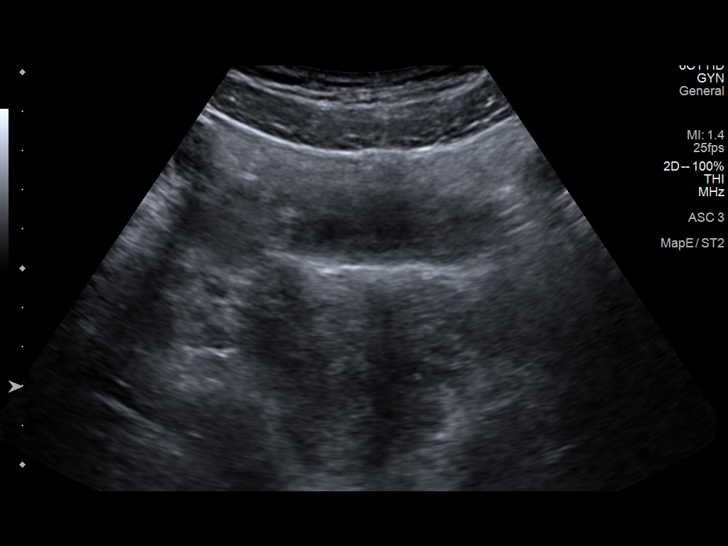
[im 23/90]
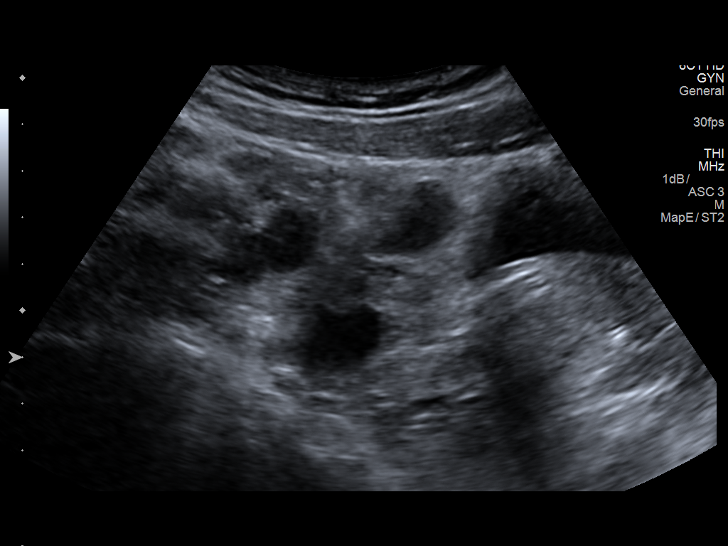
[im 30/90]
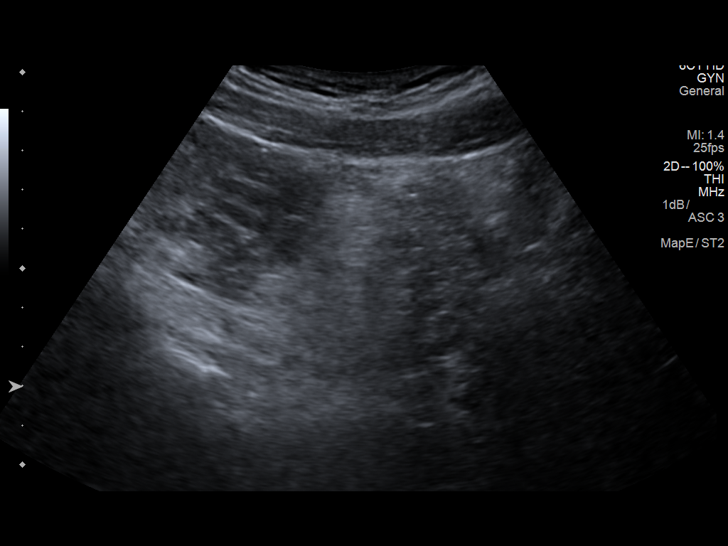
[im 34/90]
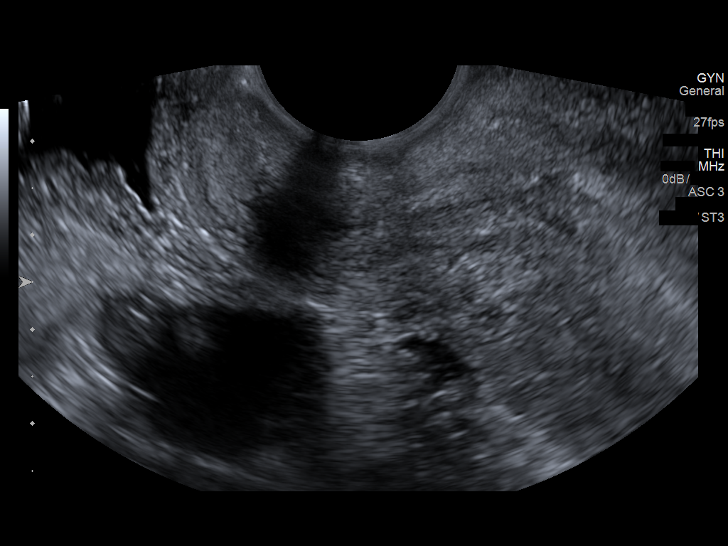
[im 41/90]
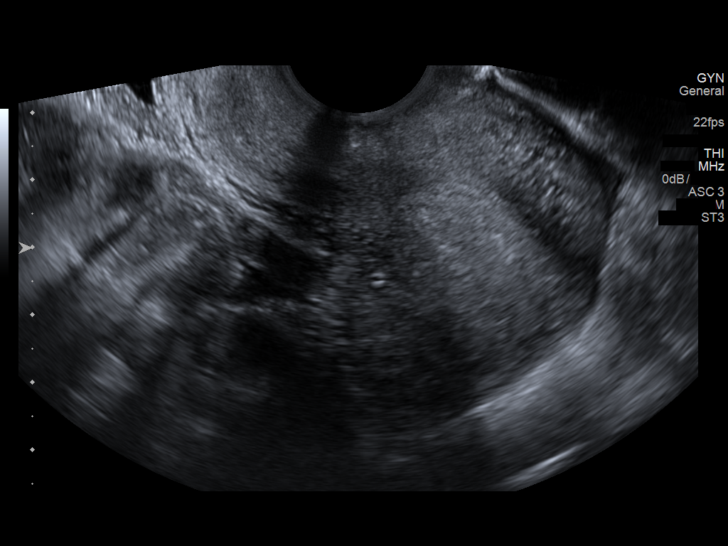
[im 49/90]
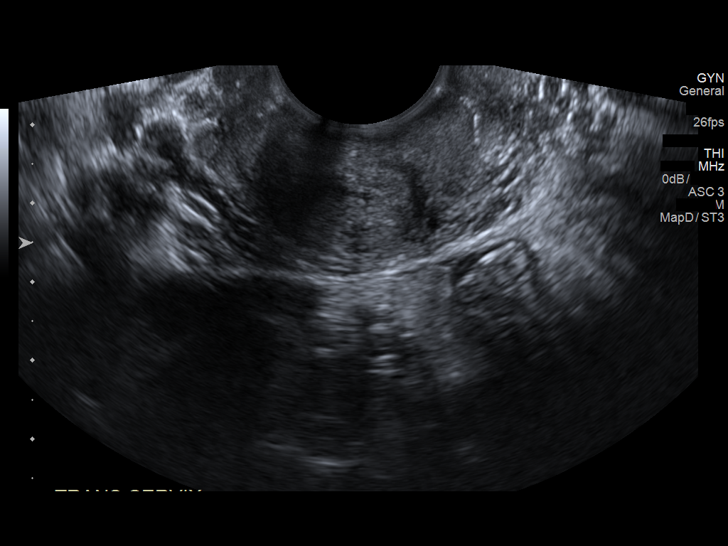
[im 56/90]
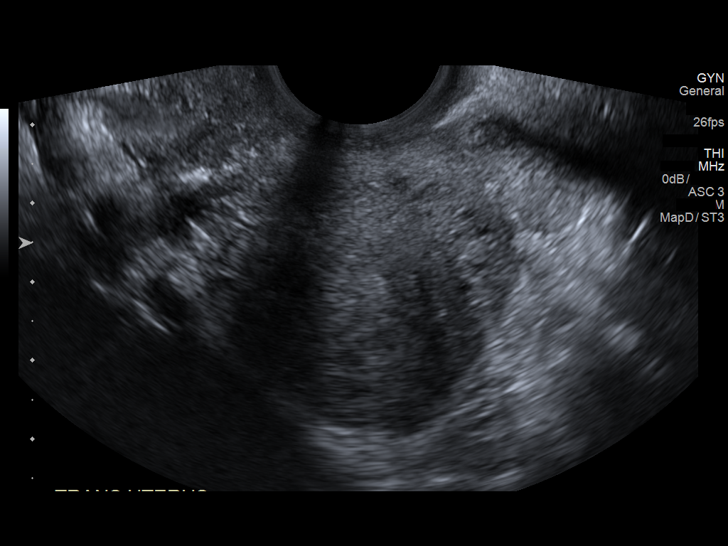
[im 60/90]
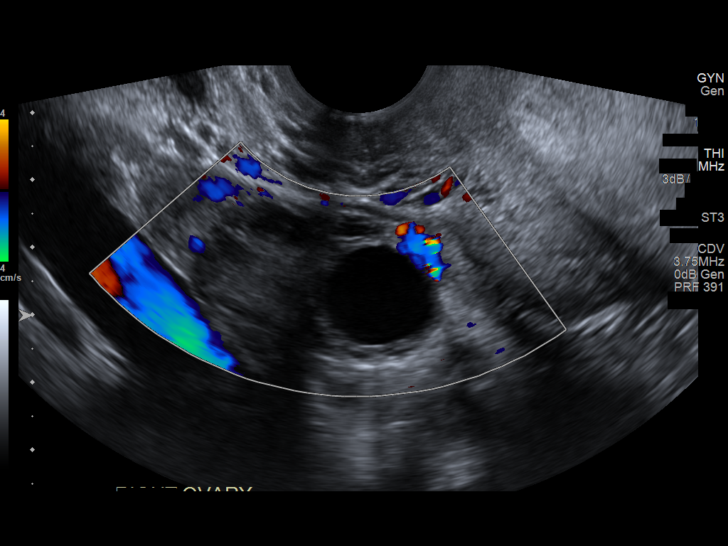
[im 67/90]
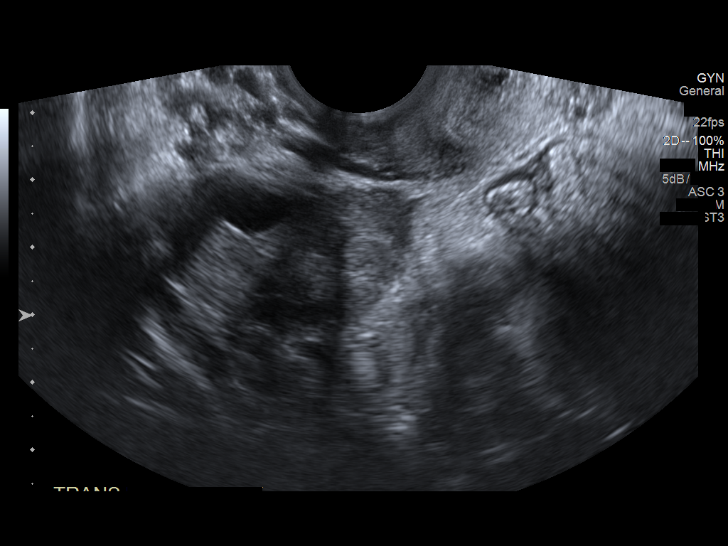
[im 75/90]
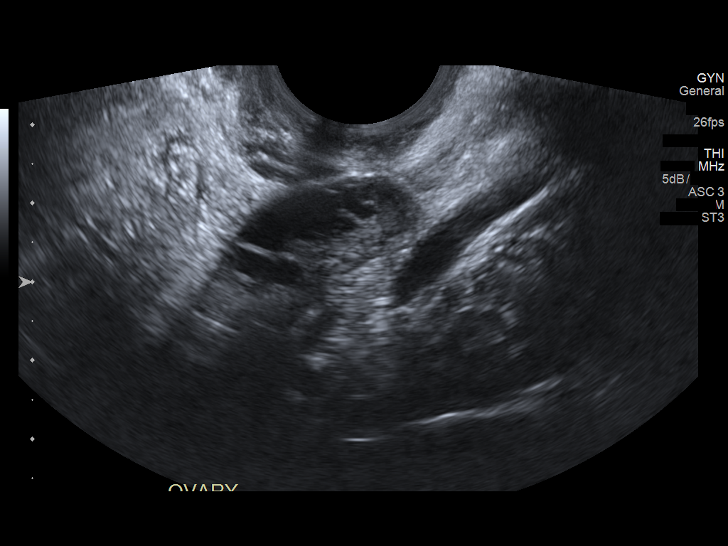
[im 82/90]
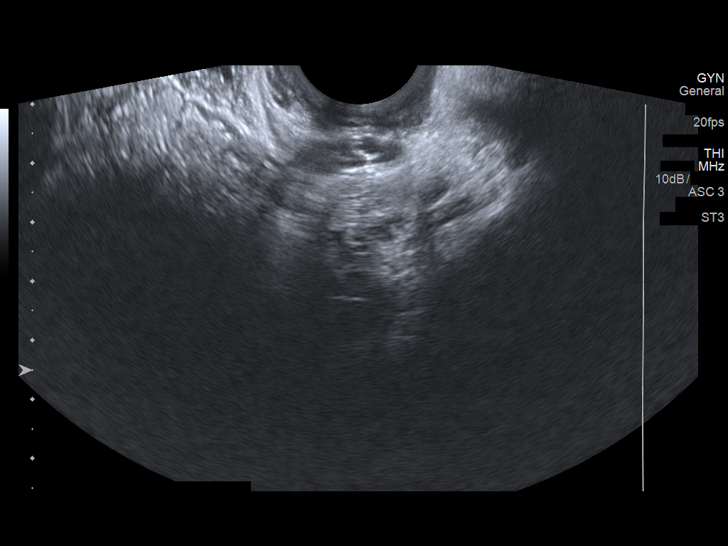
[im 90/90]
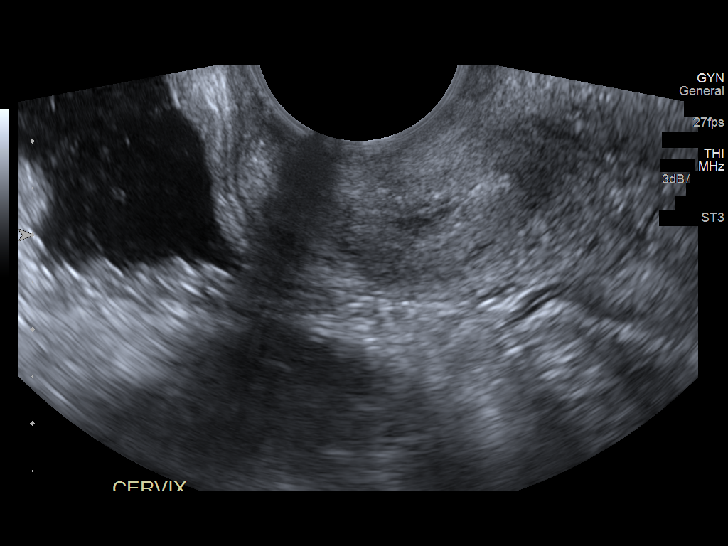

[14 of 25 positions shown; findings below may reference images not displayed]

FINDINGS: Uterus

Measurements: 6.5 x 3.7 x 4.7 cm. No fibroids or other mass
visualized.

Endometrium

Thickness: 6.7 mm.  No focal abnormality visualized.

Right ovary

Measurements: 2.8 x 2.3 x 2.7 cm. There are follicles the largest
measuring 1.7 x 1.4 x 1.7 cm.

Left ovary

Measurements: 2.8 x 1.5 x 2.0 cm. Normal appearance/no adnexal mass.

Other findings

No abnormal free fluid.
IMPRESSION: Unremarkable pelvic ultrasound for age. No acute process identified.

By: Wenqing Parise M.D.

## 2020-03-06 ENCOUNTER — Emergency Department (HOSPITAL_COMMUNITY)
Admission: EM | Admit: 2020-03-06 | Discharge: 2020-03-06 | Disposition: A | Discharge status: Home / Self Care | Payer: Medicaid Other | Attending: Emergency Medicine | Admitting: Emergency Medicine

## 2020-03-06 ENCOUNTER — Other Ambulatory Visit: Payer: Self-pay

## 2020-03-06 DIAGNOSIS — Z5321 Procedure and treatment not carried out due to patient leaving prior to being seen by health care provider: Secondary | ICD-10-CM | POA: Diagnosis not present

## 2020-03-06 DIAGNOSIS — R109 Unspecified abdominal pain: Secondary | ICD-10-CM | POA: Diagnosis present

## 2020-03-06 NOTE — ED Notes (Signed)
Pt called 3x for triage with no response. Pt not seen within waiting room, waiting room bathrooms, or outside lobby doors.

## 2020-03-06 NOTE — ED Notes (Signed)
Called to triage, unable to locate at this time. 

## 2020-03-19 ENCOUNTER — Encounter (HOSPITAL_COMMUNITY): Payer: Self-pay | Admitting: Obstetrics & Gynecology

## 2020-03-19 ENCOUNTER — Inpatient Hospital Stay (HOSPITAL_COMMUNITY): Payer: Medicaid Other

## 2020-03-19 ENCOUNTER — Inpatient Hospital Stay (HOSPITAL_COMMUNITY)
Admission: AD | Admit: 2020-03-19 | Discharge: 2020-03-19 | Disposition: A | Discharge status: Home / Self Care | Payer: Medicaid Other | Attending: Obstetrics & Gynecology | Admitting: Obstetrics & Gynecology

## 2020-03-19 ENCOUNTER — Other Ambulatory Visit: Payer: Self-pay

## 2020-03-19 DIAGNOSIS — O99511 Diseases of the respiratory system complicating pregnancy, first trimester: Secondary | ICD-10-CM | POA: Insufficient documentation

## 2020-03-19 DIAGNOSIS — J45909 Unspecified asthma, uncomplicated: Secondary | ICD-10-CM | POA: Diagnosis not present

## 2020-03-19 DIAGNOSIS — O468X1 Other antepartum hemorrhage, first trimester: Secondary | ICD-10-CM

## 2020-03-19 DIAGNOSIS — O23591 Infection of other part of genital tract in pregnancy, first trimester: Secondary | ICD-10-CM | POA: Insufficient documentation

## 2020-03-19 DIAGNOSIS — O26892 Other specified pregnancy related conditions, second trimester: Secondary | ICD-10-CM | POA: Diagnosis not present

## 2020-03-19 DIAGNOSIS — O26891 Other specified pregnancy related conditions, first trimester: Secondary | ICD-10-CM

## 2020-03-19 DIAGNOSIS — Z3490 Encounter for supervision of normal pregnancy, unspecified, unspecified trimester: Secondary | ICD-10-CM

## 2020-03-19 DIAGNOSIS — Z3A01 Less than 8 weeks gestation of pregnancy: Secondary | ICD-10-CM | POA: Diagnosis not present

## 2020-03-19 DIAGNOSIS — O208 Other hemorrhage in early pregnancy: Secondary | ICD-10-CM | POA: Diagnosis not present

## 2020-03-19 DIAGNOSIS — B9689 Other specified bacterial agents as the cause of diseases classified elsewhere: Secondary | ICD-10-CM | POA: Diagnosis not present

## 2020-03-19 DIAGNOSIS — N760 Acute vaginitis: Secondary | ICD-10-CM

## 2020-03-19 DIAGNOSIS — R109 Unspecified abdominal pain: Secondary | ICD-10-CM

## 2020-03-19 DIAGNOSIS — O26899 Other specified pregnancy related conditions, unspecified trimester: Secondary | ICD-10-CM

## 2020-03-19 HISTORY — DX: Headache, unspecified: R51.9

## 2020-03-19 LAB — HCG, QUANTITATIVE, PREGNANCY: hCG, Beta Chain, Quant, S: 136174 m[IU]/mL — ABNORMAL HIGH (ref <5)

## 2020-03-19 LAB — COMPREHENSIVE METABOLIC PANEL
ALT: 17 U/L (ref 0–44)
AST: 21 U/L (ref 15–41)
Albumin: 3.9 g/dL (ref 3.5–5.0)
Alkaline Phosphatase: 52 U/L (ref 38–126)
Anion gap: 11 (ref 5–15)
BUN: 5 mg/dL — ABNORMAL LOW (ref 6–20)
CO2: 20 mmol/L — ABNORMAL LOW (ref 22–32)
Calcium: 9.2 mg/dL (ref 8.9–10.3)
Chloride: 104 mmol/L (ref 98–111)
Creatinine, Ser: 0.58 mg/dL (ref 0.44–1.00)
GFR calc Af Amer: >60 mL/min (ref >60)
GFR calc non Af Amer: >60 mL/min (ref >60)
Glucose, Bld: 72 mg/dL (ref 70–99)
Potassium: 3.6 mmol/L (ref 3.5–5.1)
Sodium: 135 mmol/L (ref 135–145)
Total Bilirubin: 0.3 mg/dL (ref 0.3–1.2)
Total Protein: 7.3 g/dL (ref 6.5–8.1)

## 2020-03-19 LAB — TYPE AND SCREEN
ABO/RH(D): O POS
Antibody Screen: NEGATIVE

## 2020-03-19 LAB — CBC
HCT: 37.2 % (ref 36.0–46.0)
Hemoglobin: 11.1 g/dL — ABNORMAL LOW (ref 12.0–15.0)
MCH: 22.1 pg — ABNORMAL LOW (ref 26.0–34.0)
MCHC: 29.8 g/dL — ABNORMAL LOW (ref 30.0–36.0)
MCV: 74.1 fL — ABNORMAL LOW (ref 80.0–100.0)
Platelets: 282 10*3/uL (ref 150–400)
RBC: 5.02 MIL/uL (ref 3.87–5.11)
RDW: 17.2 % — ABNORMAL HIGH (ref 11.5–15.5)
WBC: 10.8 10*3/uL — ABNORMAL HIGH (ref 4.0–10.5)
nRBC: 0 % (ref 0.0–0.2)

## 2020-03-19 LAB — URINALYSIS, ROUTINE W REFLEX MICROSCOPIC
Bilirubin Urine: NEGATIVE
Glucose, UA: NEGATIVE mg/dL
Hgb urine dipstick: NEGATIVE
Ketones, ur: NEGATIVE mg/dL
Leukocytes,Ua: NEGATIVE
Nitrite: NEGATIVE
Protein, ur: NEGATIVE mg/dL
Specific Gravity, Urine: 1.003 — ABNORMAL LOW (ref 1.005–1.030)
pH: 5 (ref 5.0–8.0)

## 2020-03-19 LAB — WET PREP, GENITAL
Sperm: NONE SEEN
Trich, Wet Prep: NONE SEEN
Yeast Wet Prep HPF POC: NONE SEEN

## 2020-03-19 LAB — ABO/RH: ABO/RH(D): O POS

## 2020-03-19 LAB — POCT PREGNANCY, URINE: Preg Test, Ur: POSITIVE — AB

## 2020-03-19 MED ORDER — METRONIDAZOLE 500 MG PO TABS
500.0000 mg | ORAL_TABLET | Freq: Two times a day (BID) | ORAL | 0 refills | Status: AC
Start: 2020-03-19 — End: 2020-03-26

## 2020-03-19 NOTE — MAU Provider Note (Signed)
Chief Complaint: Abdominal Pain  SUBJECTIVE HPI: Dana Oneal is a 24 y.o. G2P1001 at <[redacted] week GA based on uncertain LMP (late 12/2019-early 01/2020) who presents to maternity admissions reporting several weeks of worsening pelvic pain. Pt reports a positive home pregnancy test in early July at home followed by vaginal spotting s/p vaginal intercourse in late July. This is a planned pregnancy. No history of recent trauma. Pt plans to have prenatal care at health department but has not yet received a call back with a specific appointment date/time.  She denies recent vaginal bleeding, vaginal itching/burning, urinary symptoms, h/a, dizziness, n/v, or fever/chills.    Location: lower abdominal quadrants Quality: cramping, intermittent, worsening from prior Severity: 7/10 on pain scale Duration: since positive pregnancy test in early 02/2020 Timing: intermittent Modifying factors: worse with movement Associated signs and symptoms: nausea without vomiting  HPI  Past Medical History:  Diagnosis Date  . Asthma    last inhaler use "in middle school"  . Headache    Past Surgical History:  Procedure Laterality Date  . NO PAST SURGERIES     Social History   Socioeconomic History  . Marital status: Single    Spouse name: Not on file  . Number of children: Not on file  . Years of education: Not on file  . Highest education level: Not on file  Occupational History  . Not on file  Tobacco Use  . Smoking status: Passive Smoke Exposure - Never Smoker  . Smokeless tobacco: Never Used  Vaping Use  . Vaping Use: Never used  Substance and Sexual Activity  . Alcohol use: No  . Drug use: No  . Sexual activity: Yes    Birth control/protection: None  Other Topics Concern  . Not on file  Social History Narrative  . Not on file   Social Determinants of Health   Financial Resource Strain:   . Difficulty of Paying Living Expenses:   Food Insecurity:   . Worried About Programme researcher, broadcasting/film/video  in the Last Year:   . Barista in the Last Year:   Transportation Needs:   . Freight forwarder (Medical):   Marland Kitchen Lack of Transportation (Non-Medical):   Physical Activity:   . Days of Exercise per Week:   . Minutes of Exercise per Session:   Stress:   . Feeling of Stress :   Social Connections:   . Frequency of Communication with Friends and Family:   . Frequency of Social Gatherings with Friends and Family:   . Attends Religious Services:   . Active Member of Clubs or Organizations:   . Attends Banker Meetings:   Marland Kitchen Marital Status:   Intimate Partner Violence:   . Fear of Current or Ex-Partner:   . Emotionally Abused:   Marland Kitchen Physically Abused:   . Sexually Abused:    No current facility-administered medications on file prior to encounter.   Current Outpatient Medications on File Prior to Encounter  Medication Sig Dispense Refill  . Prenatal Vit w/Fe-Methylfol-FA (PNV PO) Take 1 tablet daily by mouth.      No Known Allergies  ROS:  Review of Systems  Constitutional: Negative for activity change, appetite change, chills and fever.  HENT: Negative for sore throat.   Respiratory: Negative for cough and shortness of breath.   Cardiovascular: Negative for chest pain.  Gastrointestinal: Positive for abdominal pain and nausea. Negative for constipation, diarrhea and vomiting.  Genitourinary: Negative for dysuria, flank pain, vaginal  discharge and vaginal pain.  Musculoskeletal: Negative for back pain and myalgias.  Neurological: Negative for dizziness.   I have reviewed patient's Past Medical Hx, Surgical Hx, Family Hx, Social Hx, medications and allergies.   Physical Exam   Patient Vitals for the past 24 hrs:  BP Temp Pulse Resp SpO2 Height Weight  03/19/20 1448 (!) 108/60 98.8 F (37.1 C) 75 16 97 % 5\' 1"  (1.549 m) 65.2 kg   Constitutional: Well-developed, well-nourished female in no acute distress.  Cardiovascular: normal rate Respiratory: normal  effort GI: Abd soft, moderate tenderness in lower quadrants bilaterally without rebound or guarding. Pos BS x 4. No CVA tenderness. MS: Extremities nontender, no edema, normal ROM Neurologic: Alert and oriented x 4.  GU: Neg CVAT.  PELVIC EXAM: deferred given no report of vaginal bleeding Bimanual exam: no abnormal masses palpated along uterus or adnexa bilaterally, no cervical motion tenderness  FHT 130s on formal transabdominal ultrasound.  LAB RESULTS Results for orders placed or performed during the hospital encounter of 03/19/20 (from the past 24 hour(s))  Pregnancy, urine POC     Status: Abnormal   Collection Time: 03/19/20  3:15 PM  Result Value Ref Range   Preg Test, Ur POSITIVE (A) NEGATIVE  Urinalysis, Routine w reflex microscopic     Status: Abnormal   Collection Time: 03/19/20  3:30 PM  Result Value Ref Range   Color, Urine STRAW (A) YELLOW   APPearance HAZY (A) CLEAR   Specific Gravity, Urine 1.003 (L) 1.005 - 1.030   pH 5.0 5.0 - 8.0   Glucose, UA NEGATIVE NEGATIVE mg/dL   Hgb urine dipstick NEGATIVE NEGATIVE   Bilirubin Urine NEGATIVE NEGATIVE   Ketones, ur NEGATIVE NEGATIVE mg/dL   Protein, ur NEGATIVE NEGATIVE mg/dL   Nitrite NEGATIVE NEGATIVE   Leukocytes,Ua NEGATIVE NEGATIVE  Comprehensive metabolic panel     Status: Abnormal   Collection Time: 03/19/20  5:10 PM  Result Value Ref Range   Sodium 135 135 - 145 mmol/L   Potassium 3.6 3.5 - 5.1 mmol/L   Chloride 104 98 - 111 mmol/L   CO2 20 (L) 22 - 32 mmol/L   Glucose, Bld 72 70 - 99 mg/dL   BUN 5 (L) 6 - 20 mg/dL   Creatinine, Ser 03/21/20 0.44 - 1.00 mg/dL   Calcium 9.2 8.9 - 1.61 mg/dL   Total Protein 7.3 6.5 - 8.1 g/dL   Albumin 3.9 3.5 - 5.0 g/dL   AST 21 15 - 41 U/L   ALT 17 0 - 44 U/L   Alkaline Phosphatase 52 38 - 126 U/L   Total Bilirubin 0.3 0.3 - 1.2 mg/dL   GFR calc non Af Amer >60 >60 mL/min   GFR calc Af Amer >60 >60 mL/min   Anion gap 11 5 - 15  CBC     Status: Abnormal   Collection Time:  03/19/20  5:10 PM  Result Value Ref Range   WBC 10.8 (H) 4.0 - 10.5 K/uL   RBC 5.02 3.87 - 5.11 MIL/uL   Hemoglobin 11.1 (L) 12.0 - 15.0 g/dL   HCT 03/21/20 36 - 46 %   MCV 74.1 (L) 80.0 - 100.0 fL   MCH 22.1 (L) 26.0 - 34.0 pg   MCHC 29.8 (L) 30.0 - 36.0 g/dL   RDW 04.5 (H) 40.9 - 81.1 %   Platelets 282 150 - 400 K/uL   nRBC 0.0 0.0 - 0.2 %  Type and screen [facility]  Status: None   Collection Time: 03/19/20  5:10 PM  Result Value Ref Range   ABO/RH(D) O POS    Antibody Screen NEG    Sample Expiration      03/22/2020,2359 Performed at [facility], [address] [place], [place] [address]   hCG, quantitative, pregnancy     Status: Abnormal   Collection Time: 03/19/20  5:10 PM  Result Value Ref Range   hCG, Beta Chain, Quant, S 136,174 (H) <5 mIU/mL  Wet prep, genital     Status: Abnormal   Collection Time: 03/19/20  5:40 PM   Specimen: Vaginal  Result Value Ref Range   Yeast Wet Prep HPF POC NONE SEEN NONE SEEN   Trich, Wet Prep NONE SEEN NONE SEEN   Clue Cells Wet Prep HPF POC PRESENT (A) NONE SEEN   WBC, Wet Prep HPF POC FEW (A) NONE SEEN   Sperm NONE SEEN   ABO/Rh     Status: None   Collection Time: 03/19/20  5:57 PM  Result Value Ref Range   ABO/RH(D)      O POS Performed at [facility], [address] [place], [place] [address]    --/--/O POS Performed at [facility], [address] [place], [place] [address]  [phone] [phone])  IMAGING US OB Comp Less 14 Wks  Result Date: 03/19/2020 CLINICAL DATA:  Pelvic cramping. EXAM: OBSTETRIC <14 WK ULTRASOUND TECHNIQUE: Transabdominal ultrasound was performed for evaluation of the gestation as well as the maternal uterus and adnexal regions. COMPARISON:  None. FINDINGS: Intrauterine gestational sac: Single Yolk sac:  Not Visualized. Embryo:  Not Visualized. Cardiac Activity: Not Visualized. Heart Rate: 130 bpm CRL:   9.1 mm   6 w 6 d                  [place] EDC: November 06, 2020 Subchorionic  hemorrhage:  A small subchorionic hemorrhage is noted. Maternal uterus/adnexae: The right and left ovaries are visualized and are normal in appearance. No pelvic free fluid is seen. IMPRESSION: 1. Live, single intrauterine pregnancy at approximately 6 weeks and 6 days gestation by ultrasound evaluation. 2. Small subchorionic hemorrhage. Electronically Signed   By: Aram Candela M.D.   On: 03/19/2020 18:32    MAU Management/MDM: Orders Placed This Encounter  Procedures  . Wet prep, genital  . US OB Comp Less 14 Wks  . Urinalysis, Routine w reflex microscopic  . Comprehensive metabolic panel  . CBC  . hCG, quantitative, pregnancy  . Pregnancy, urine POC  . Type and screen [facility]  . ABO/Rh  . Discharge patient  . Discharge patient    Meds ordered this encounter  Medications  . metroNIDAZOLE (FLAGYL) 500 MG tablet    Sig: Take 1 tablet (500 mg total) by mouth 2 (two) times daily for 7 days.    Dispense:  14 tablet    Refill:  0    May substitute generic    Treatments in MAU included physical exam, labs, ultrasound. Pt discharged with strict return precautions for signs/symptoms of bleeding given finding of small subchorionic hemorrhage on [place].  ASSESSMENT 1. Bacterial vaginosis. Clue cells present on wet prep.  2. Abdominal pain affecting pregnancy. Most likely secondary to pregnancy and BV. UA unremarkable and GC/Chlamydia collected and pending at time of discharge.  3. Intrauterine pregnancy. Confirmed on formal US today. GA [redacted]w[redacted]d.  4. Small subchorionic hemorrhage. Minimal spotting earlier in pregnancy. No recent  bleeding. -Provided strict return precautions for vaginal bleeding.   PLAN -Patient provided script for flagyl 500mg  BID x7d given diagnosis of BV on wet prep. -Discharge home with plan to follow-up with health department for first prenatal appointment. Instructed to call sooner if concerns as noted above.  Allergies as of 03/19/2020   No Known  Allergies    Lynnda ShieldsAnna Jaysa Kise, MD OB Fellow 03/19/2020  7:30 PM

## 2020-03-19 NOTE — Discharge Instructions (Signed)
You were seen today for concern of pelvic pain. We performed labs and an ultrasound.  Your wet prep showed Clue Cells, which indicates bacterial vaginosis. Take flagyl 500mg  twice daily for 7 days.  Your ultrasound showed a pregnancy in the uterus with normal fetal heart tones. Your dating is 6 weeks and 6 days based on your ultrasound today. There was also a small subchorionic hematoma. It will be important to call if you have concern of vaginal bleeding.  Subchorionic Hematoma  A subchorionic hematoma is a gathering of blood between the outer wall of the embryo (chorion) and the inner wall of the womb (uterus). This condition can cause vaginal bleeding. If they cause little or no vaginal bleeding, early small hematomas usually shrink on their own and do not affect your baby or pregnancy. When bleeding starts later in pregnancy, or if the hematoma is larger or occurs in older pregnant women, the condition may be more serious. Larger hematomas may get bigger, which increases the chances of miscarriage. This condition also increases the risk of:  Premature separation of the placenta from the uterus.  Premature (preterm) labor.  Stillbirth. What are the causes? The exact cause of this condition is not known. It occurs when blood is trapped between the placenta and the uterine wall because the placenta has separated from the original site of implantation. What increases the risk? You are more likely to develop this condition if:  You were treated with fertility medicines.  You conceived through in vitro fertilization (IVF). What are the signs or symptoms? Symptoms of this condition include:  Vaginal spotting or bleeding.  Contractions of the uterus. These cause abdominal pain. Sometimes you may have no symptoms and the bleeding may only be seen when ultrasound images are taken (transvaginal ultrasound). How is this diagnosed? This condition is diagnosed based on a physical exam. This  includes a pelvic exam. You may also have other tests, including:  Blood tests.  Urine tests.  Ultrasound of the abdomen. How is this treated? Treatment for this condition can vary. Treatment may include:  Watchful waiting. You will be monitored closely for any changes in bleeding. During this stage: ? The hematoma may be reabsorbed by the body. ? The hematoma may separate the fluid-filled space containing the embryo (gestational sac) from the wall of the womb (endometrium).  Medicines.  Activity restriction. This may be needed until the bleeding stops. Follow these instructions at home:  Stay on bed rest if told to do so by your health care provider.  Do not lift anything that is heavier than 10 lbs. (4.5 kg) or as told by your health care provider.  Do not use any products that contain nicotine or tobacco, such as cigarettes and e-cigarettes. If you need help quitting, ask your health care provider.  Track and write down the number of pads you use each day and how soaked (saturated) they are.  Do not use tampons.  Keep all follow-up visits as told by your health care provider. This is important. Your health care provider may ask you to have follow-up blood tests or ultrasound tests or both. Contact a health care provider if:  You have any vaginal bleeding.  You have a fever. Get help right away if:  You have severe cramps in your stomach, back, abdomen, or pelvis.  You pass large clots or tissue. Save any tissue for your health care provider to look at.  You have more vaginal bleeding, and you faint or become  lightheaded or weak. Summary  A subchorionic hematoma is a gathering of blood between the outer wall of the placenta and the uterus.  This condition can cause vaginal bleeding.  Sometimes you may have no symptoms and the bleeding may only be seen when ultrasound images are taken.  Treatment may include watchful waiting, medicines, or activity  restriction. This information is not intended to replace advice given to you by your health care provider. Make sure you discuss any questions you have with your health care provider. Document Revised: 07/27/2017 Document Reviewed: 10/10/2016 Elsevier Patient Education  2020 [employer].  Please follow-up with the health department for your first prenatal appointment or call sooner if concern of worsening abdominal pain, vaginal bleeding, persistent nausea/vomiting with inability to tolerate fluids by mouth or other concerns.

## 2020-03-19 NOTE — MAU Note (Signed)
Is pregnant, has been having cramping in abd since found out she was preg (about 3 wks ago). Had a little spotting after intercourse prior to finding out she was preg, none since.  preg confirmed at health Dept, does not have paperwork with her.

## 2020-03-22 LAB — GC/CHLAMYDIA PROBE AMP (~~LOC~~) NOT AT ARMC
Chlamydia: NEGATIVE
Comment: NEGATIVE
Comment: NORMAL
Neisseria Gonorrhea: NEGATIVE

## 2020-04-12 ENCOUNTER — Other Ambulatory Visit: Payer: Self-pay | Admitting: Nurse Practitioner

## 2020-04-12 DIAGNOSIS — Z369 Encounter for antenatal screening, unspecified: Secondary | ICD-10-CM

## 2020-04-12 DIAGNOSIS — Z3A10 10 weeks gestation of pregnancy: Secondary | ICD-10-CM

## 2020-04-12 LAB — OB RESULTS CONSOLE RUBELLA ANTIBODY, IGM: Rubella: IMMUNE

## 2020-04-12 LAB — OB RESULTS CONSOLE HEPATITIS B SURFACE ANTIGEN: Hepatitis B Surface Ag: NEGATIVE

## 2020-04-12 LAB — OB RESULTS CONSOLE RPR: RPR: NONREACTIVE

## 2020-04-12 LAB — OB RESULTS CONSOLE GC/CHLAMYDIA: Gonorrhea: NEGATIVE

## 2020-04-12 LAB — OB RESULTS CONSOLE HIV ANTIBODY (ROUTINE TESTING): HIV: NONREACTIVE

## 2020-04-27 ENCOUNTER — Ambulatory Visit: Payer: Medicaid Other

## 2020-04-27 ENCOUNTER — Other Ambulatory Visit: Payer: Medicaid Other

## 2020-04-27 ENCOUNTER — Ambulatory Visit: Payer: Medicaid Other | Attending: Nurse Practitioner

## 2020-04-29 ENCOUNTER — Other Ambulatory Visit: Payer: Medicaid Other

## 2020-04-29 ENCOUNTER — Other Ambulatory Visit: Payer: Self-pay

## 2020-04-29 ENCOUNTER — Ambulatory Visit: Payer: Self-pay

## 2020-04-30 ENCOUNTER — Other Ambulatory Visit: Payer: Self-pay

## 2020-04-30 ENCOUNTER — Ambulatory Visit: Payer: Self-pay

## 2020-08-28 NOTE — L&D Delivery Note (Signed)
Patient: Dana Oneal MRN: [id number]  GBS status: Positive, IAP given: PCN   Patient is a 25 y.o. now G2P2 s/p NSVD at [redacted]w[redacted]d, who was admitted for IOL for IUGR. AROM 1h 80m prior to delivery with clear fluid.    Delivery Note At 3:03 PM a viable female was delivered via Vaginal, Spontaneous (Presentation: Left Occiput Anterior).  APGAR: 9, 10; weight pending.   Placenta status: Spontaneous, Intact.  Cord: 3 vessels with the following complications: None.    Anesthesia:  Epidural  Episiotomy: None Lacerations: First Degree Perineal  Suture Repair: N/A Est. Blood Loss (mL): 250   Head delivered LOA. No nuchal cord present. Shoulder and body delivered in usual fashion. Infant with spontaneous cry, placed on mother's abdomen, dried and bulb suctioned. Cord clamped x 2 after 1-minute delay, and cut by family member. Cord blood drawn. Placenta delivered spontaneously with gentle cord traction. Pitocin started and fundal massage given; had brisk bleeding noted so TXA was also administered.  Fundus firm and bleeding normalized with previously mentioned interventions. Perineum inspected and found to have very small first degree laceration, which was found to be hemostatic.  Mom to postpartum.  Baby to Couplet care / Skin to Skin.  De Hollingshead 11/02/2020, 3:33 PM

## 2020-09-23 ENCOUNTER — Other Ambulatory Visit: Payer: Self-pay | Admitting: Obstetrics & Gynecology

## 2020-09-23 DIAGNOSIS — D509 Iron deficiency anemia, unspecified: Secondary | ICD-10-CM

## 2020-09-24 ENCOUNTER — Ambulatory Visit (HOSPITAL_COMMUNITY)
Admission: RE | Admit: 2020-09-24 | Discharge: 2020-09-24 | Disposition: A | Discharge status: Home / Self Care | Payer: Medicaid Other | Source: Ambulatory Visit | Attending: Obstetrics & Gynecology | Admitting: Obstetrics & Gynecology

## 2020-09-24 ENCOUNTER — Other Ambulatory Visit: Payer: Self-pay

## 2020-09-24 DIAGNOSIS — D509 Iron deficiency anemia, unspecified: Secondary | ICD-10-CM | POA: Insufficient documentation

## 2020-09-24 DIAGNOSIS — O99013 Anemia complicating pregnancy, third trimester: Secondary | ICD-10-CM | POA: Diagnosis not present

## 2020-09-24 MED ORDER — SODIUM CHLORIDE 0.9 % IV SOLN
510.0000 mg | INTRAVENOUS | Status: DC
  Administered 2020-09-24: 510 mg via INTRAVENOUS
  Filled 2020-09-24: qty 17

## 2020-09-24 NOTE — Discharge Instructions (Signed)
Ferumoxytol injection What is this medicine? FERUMOXYTOL is an iron complex. Iron is used to make healthy red blood cells, which carry oxygen and nutrients throughout the body. This medicine is used to treat iron deficiency anemia. This medicine may be used for other purposes; ask your health care provider or pharmacist if you have questions. COMMON BRAND NAME(S): Feraheme What should I tell my health care provider before I take this medicine? They need to know if you have any of these conditions:  anemia not caused by low iron levels  high levels of iron in the blood  magnetic resonance imaging (MRI) test scheduled  an unusual or allergic reaction to iron, other medicines, foods, dyes, or preservatives  pregnant or trying to get pregnant  breast-feeding How should I use this medicine? This medicine is for injection into a vein. It is given by a health care professional in a hospital or clinic setting. Talk to your pediatrician regarding the use of this medicine in children. Special care may be needed. Overdosage: If you think you have taken too much of this medicine contact a poison control center or emergency room at once. NOTE: This medicine is only for you. Do not share this medicine with others. What if I miss a dose? It is important not to miss your dose. Call your doctor or health care professional if you are unable to keep an appointment. What may interact with this medicine? This medicine may interact with the following medications:  other iron products This list may not describe all possible interactions. Give your health care provider a list of all the medicines, herbs, non-prescription drugs, or dietary supplements you use. Also tell them if you smoke, drink alcohol, or use illegal drugs. Some items may interact with your medicine. What should I watch for while using this medicine? Visit your doctor or healthcare professional regularly. Tell your doctor or healthcare  professional if your symptoms do not start to get better or if they get worse. You may need blood work done while you are taking this medicine. You may need to follow a special diet. Talk to your doctor. Foods that contain iron include: whole grains/cereals, dried fruits, beans, or peas, leafy green vegetables, and organ meats (liver, kidney). What side effects may I notice from receiving this medicine? Side effects that you should report to your doctor or health care professional as soon as possible:  allergic reactions like skin rash, itching or hives, swelling of the face, lips, or tongue  breathing problems  changes in blood pressure  feeling faint or lightheaded, falls  fever or chills  flushing, sweating, or hot feelings  swelling of the ankles or feet Side effects that usually do not require medical attention (report to your doctor or health care professional if they continue or are bothersome):  diarrhea  headache  nausea, vomiting  stomach pain This list may not describe all possible side effects. Call your doctor for medical advice about side effects. You may report side effects to FDA at 1-800-FDA-1088. Where should I keep my medicine? This drug is given in a hospital or clinic and will not be stored at home. NOTE: This sheet is a summary. It may not cover all possible information. If you have questions about this medicine, talk to your doctor, pharmacist, or health care provider.  2021 Elsevier/Gold Standard (2016-10-02 20:21:10)  

## 2020-10-01 ENCOUNTER — Ambulatory Visit (HOSPITAL_COMMUNITY)
Admission: RE | Admit: 2020-10-01 | Discharge: 2020-10-01 | Disposition: A | Discharge status: Home / Self Care | Payer: Medicaid Other | Source: Ambulatory Visit | Attending: Obstetrics & Gynecology | Admitting: Obstetrics & Gynecology

## 2020-10-01 ENCOUNTER — Other Ambulatory Visit: Payer: Self-pay

## 2020-10-01 DIAGNOSIS — D509 Iron deficiency anemia, unspecified: Secondary | ICD-10-CM | POA: Insufficient documentation

## 2020-10-01 DIAGNOSIS — O99013 Anemia complicating pregnancy, third trimester: Secondary | ICD-10-CM | POA: Insufficient documentation

## 2020-10-01 MED ORDER — SODIUM CHLORIDE 0.9 % IV SOLN
510.0000 mg | INTRAVENOUS | Status: AC
  Administered 2020-10-01: 510 mg via INTRAVENOUS
  Filled 2020-10-01: qty 17

## 2020-10-11 LAB — OB RESULTS CONSOLE GBS: GBS: POSITIVE

## 2020-11-01 ENCOUNTER — Other Ambulatory Visit: Payer: Self-pay | Admitting: Obstetrics & Gynecology

## 2020-11-01 NOTE — Progress Notes (Signed)
     Faculty Practice OB/GYN Physician Phone Call Documentation  I had a phone conversation about Dana Oneal with [facility] NP, she had a scan that showed IUGR at 6% for EFW. No other anomalies.  Patient is G2P1001 at [redacted]w[redacted]d, GBS positive.  Recommended IOL, scheduled tomorrow at midnight, patient told to come in tonight at 11:45 pm. Lorriane Shire NP to send her records including report of ultrasound.  Orders signed and held. L&D team informed.     Jaynie Collins, MD, FACOG Obstetrician & Gynecologist, [facility] for [employer], [facility]

## 2020-11-02 ENCOUNTER — Inpatient Hospital Stay (HOSPITAL_COMMUNITY): Payer: Medicaid Other | Admitting: Anesthesiology

## 2020-11-02 ENCOUNTER — Inpatient Hospital Stay (HOSPITAL_COMMUNITY): Payer: Medicaid Other

## 2020-11-02 ENCOUNTER — Encounter (HOSPITAL_COMMUNITY): Payer: Self-pay | Admitting: Obstetrics & Gynecology

## 2020-11-02 ENCOUNTER — Inpatient Hospital Stay (HOSPITAL_COMMUNITY)
Admission: AD | Admit: 2020-11-02 | Discharge: 2020-11-03 | DRG: 807 | Disposition: A | Discharge status: Home / Self Care | Payer: Medicaid Other | Attending: Family Medicine | Admitting: Family Medicine

## 2020-11-02 ENCOUNTER — Other Ambulatory Visit: Payer: Self-pay

## 2020-11-02 DIAGNOSIS — O365931 Maternal care for other known or suspected poor fetal growth, third trimester, fetus 1: Secondary | ICD-10-CM | POA: Diagnosis present

## 2020-11-02 DIAGNOSIS — R339 Retention of urine, unspecified: Secondary | ICD-10-CM | POA: Diagnosis not present

## 2020-11-02 DIAGNOSIS — Z20822 Contact with and (suspected) exposure to covid-19: Secondary | ICD-10-CM | POA: Diagnosis present

## 2020-11-02 DIAGNOSIS — Z3490 Encounter for supervision of normal pregnancy, unspecified, unspecified trimester: Secondary | ICD-10-CM | POA: Diagnosis present

## 2020-11-02 DIAGNOSIS — O99820 Streptococcus B carrier state complicating pregnancy: Secondary | ICD-10-CM | POA: Diagnosis not present

## 2020-11-02 DIAGNOSIS — O99893 Other specified diseases and conditions complicating puerperium: Secondary | ICD-10-CM | POA: Diagnosis not present

## 2020-11-02 DIAGNOSIS — O365930 Maternal care for other known or suspected poor fetal growth, third trimester, not applicable or unspecified: Principal | ICD-10-CM | POA: Diagnosis present

## 2020-11-02 DIAGNOSIS — Z3A39 39 weeks gestation of pregnancy: Secondary | ICD-10-CM

## 2020-11-02 DIAGNOSIS — O99824 Streptococcus B carrier state complicating childbirth: Secondary | ICD-10-CM | POA: Diagnosis present

## 2020-11-02 DIAGNOSIS — Z7722 Contact with and (suspected) exposure to environmental tobacco smoke (acute) (chronic): Secondary | ICD-10-CM | POA: Diagnosis present

## 2020-11-02 DIAGNOSIS — Z23 Encounter for immunization: Secondary | ICD-10-CM | POA: Diagnosis not present

## 2020-11-02 LAB — TYPE AND SCREEN
ABO/RH(D): O POS
Antibody Screen: NEGATIVE

## 2020-11-02 LAB — RESP PANEL BY RT-PCR (FLU A&B, COVID) ARPGX2
Influenza A by PCR: NEGATIVE
Influenza B by PCR: NEGATIVE
SARS Coronavirus 2 by RT PCR: NEGATIVE

## 2020-11-02 LAB — CBC
HCT: 35.8 % — ABNORMAL LOW (ref 36.0–46.0)
Hemoglobin: 11.3 g/dL — ABNORMAL LOW (ref 12.0–15.0)
MCH: 25.6 pg — ABNORMAL LOW (ref 26.0–34.0)
MCHC: 31.6 g/dL (ref 30.0–36.0)
MCV: 81 fL (ref 80.0–100.0)
Platelets: 214 10*3/uL (ref 150–400)
RBC: 4.42 MIL/uL (ref 3.87–5.11)
WBC: 8.4 10*3/uL (ref 4.0–10.5)
nRBC: 0 % (ref 0.0–0.2)

## 2020-11-02 LAB — RPR: RPR Ser Ql: NONREACTIVE

## 2020-11-02 MED ORDER — ACETAMINOPHEN 325 MG PO TABS
650.0000 mg | ORAL_TABLET | ORAL | Status: DC | PRN
  Administered 2020-11-02 – 2020-11-03 (×2): 650 mg via ORAL
  Filled 2020-11-02 (×2): qty 2

## 2020-11-02 MED ORDER — DIPHENHYDRAMINE HCL 25 MG PO CAPS: 25.0000 mg | ORAL_CAPSULE | Freq: Four times a day (QID) | ORAL | Status: DC | PRN

## 2020-11-02 MED ORDER — ONDANSETRON HCL 4 MG/2ML IJ SOLN: 4.0000 mg | INTRAMUSCULAR | Status: DC | PRN

## 2020-11-02 MED ORDER — SOD CITRATE-CITRIC ACID 500-334 MG/5ML PO SOLN: 30.0000 mL | ORAL | Status: DC | PRN

## 2020-11-02 MED ORDER — ACETAMINOPHEN 325 MG PO TABS: 650.0000 mg | ORAL_TABLET | ORAL | Status: DC | PRN

## 2020-11-02 MED ORDER — TRANEXAMIC ACID-NACL 1000-0.7 MG/100ML-% IV SOLN
INTRAVENOUS | Status: AC
  Administered 2020-11-02: 1000 mg
  Filled 2020-11-02: qty 100

## 2020-11-02 MED ORDER — FENTANYL-BUPIVACAINE-NACL 0.5-0.125-0.9 MG/250ML-% EP SOLN
12.0000 mL/h | EPIDURAL | Status: DC | PRN
  Administered 2020-11-02: 12 mL/h via EPIDURAL

## 2020-11-02 MED ORDER — ONDANSETRON HCL 4 MG PO TABS: 4.0000 mg | ORAL_TABLET | ORAL | Status: DC | PRN

## 2020-11-02 MED ORDER — LIDOCAINE HCL (PF) 1 % IJ SOLN: 30.0000 mL | INTRAMUSCULAR | Status: DC | PRN

## 2020-11-02 MED ORDER — ZOLPIDEM TARTRATE 5 MG PO TABS: 5.0000 mg | ORAL_TABLET | Freq: Every evening | ORAL | Status: DC | PRN

## 2020-11-02 MED ORDER — OXYCODONE-ACETAMINOPHEN 5-325 MG PO TABS: 1.0000 | ORAL_TABLET | ORAL | Status: DC | PRN

## 2020-11-02 MED ORDER — FLEET ENEMA 7-19 GM/118ML RE ENEM: 1.0000 | ENEMA | Freq: Every day | RECTAL | Status: DC | PRN

## 2020-11-02 MED ORDER — VARICELLA VIRUS VACCINE LIVE 1350 PFU/0.5ML IJ SUSR
0.5000 mL | Freq: Once | INTRAMUSCULAR | Status: AC
  Administered 2020-11-03: 0.5 mL via SUBCUTANEOUS
  Filled 2020-11-02: qty 0.5

## 2020-11-02 MED ORDER — OXYTOCIN-SODIUM CHLORIDE 30-0.9 UT/500ML-% IV SOLN: 2.5000 [IU]/h | INTRAVENOUS | Status: DC

## 2020-11-02 MED ORDER — MISOPROSTOL 50MCG HALF TABLET: 50.0000 ug | ORAL_TABLET | ORAL | Status: DC | PRN

## 2020-11-02 MED ORDER — PHENYLEPHRINE 40 MCG/ML (10ML) SYRINGE FOR IV PUSH (FOR BLOOD PRESSURE SUPPORT): 80.0000 ug | PREFILLED_SYRINGE | INTRAVENOUS | Status: DC | PRN

## 2020-11-02 MED ORDER — FENTANYL CITRATE (PF) 100 MCG/2ML IJ SOLN
50.0000 ug | INTRAMUSCULAR | Status: DC | PRN
  Administered 2020-11-02 (×5): 100 ug via INTRAVENOUS
  Filled 2020-11-02 (×5): qty 2

## 2020-11-02 MED ORDER — COCONUT OIL OIL: 1.0000 "application " | TOPICAL_OIL | Status: DC | PRN

## 2020-11-02 MED ORDER — SENNOSIDES-DOCUSATE SODIUM 8.6-50 MG PO TABS
2.0000 | ORAL_TABLET | ORAL | Status: DC
  Administered 2020-11-02 – 2020-11-03 (×2): 2 via ORAL
  Filled 2020-11-02 (×2): qty 2

## 2020-11-02 MED ORDER — LIDOCAINE HCL (PF) 1 % IJ SOLN
INTRAMUSCULAR | Status: DC | PRN
  Administered 2020-11-02: 8 mL via EPIDURAL

## 2020-11-02 MED ORDER — DIPHENHYDRAMINE HCL 50 MG/ML IJ SOLN: 12.5000 mg | INTRAMUSCULAR | Status: DC | PRN

## 2020-11-02 MED ORDER — WITCH HAZEL-GLYCERIN EX PADS: 1.0000 "application " | MEDICATED_PAD | CUTANEOUS | Status: DC | PRN

## 2020-11-02 MED ORDER — OXYTOCIN-SODIUM CHLORIDE 30-0.9 UT/500ML-% IV SOLN
1.0000 m[IU]/min | INTRAVENOUS | Status: DC
  Administered 2020-11-02: 2 m[IU]/min via INTRAVENOUS
  Filled 2020-11-02: qty 500

## 2020-11-02 MED ORDER — TERBUTALINE SULFATE 1 MG/ML IJ SOLN: 0.2500 mg | Freq: Once | INTRAMUSCULAR | Status: DC | PRN

## 2020-11-02 MED ORDER — MISOPROSTOL 25 MCG QUARTER TABLET
25.0000 ug | ORAL_TABLET | ORAL | Status: DC | PRN
  Administered 2020-11-02: 25 ug via VAGINAL
  Filled 2020-11-02: qty 1

## 2020-11-02 MED ORDER — OXYTOCIN BOLUS FROM INFUSION
333.0000 mL | Freq: Once | INTRAVENOUS | Status: AC
  Administered 2020-11-02: 333 mL via INTRAVENOUS

## 2020-11-02 MED ORDER — SIMETHICONE 80 MG PO CHEW: 80.0000 mg | CHEWABLE_TABLET | ORAL | Status: DC | PRN

## 2020-11-02 MED ORDER — TETANUS-DIPHTH-ACELL PERTUSSIS 5-2.5-18.5 LF-MCG/0.5 IM SUSY: 0.5000 mL | PREFILLED_SYRINGE | Freq: Once | INTRAMUSCULAR | Status: DC

## 2020-11-02 MED ORDER — LACTATED RINGERS IV SOLN
500.0000 mL | INTRAVENOUS | Status: DC | PRN
  Administered 2020-11-02: 250 mL via INTRAVENOUS

## 2020-11-02 MED ORDER — IBUPROFEN 600 MG PO TABS
600.0000 mg | ORAL_TABLET | Freq: Four times a day (QID) | ORAL | Status: DC
  Administered 2020-11-02 – 2020-11-03 (×4): 600 mg via ORAL
  Filled 2020-11-02 (×4): qty 1

## 2020-11-02 MED ORDER — TRANEXAMIC ACID-NACL 1000-0.7 MG/100ML-% IV SOLN: 1000.0000 mg | INTRAVENOUS | Status: DC

## 2020-11-02 MED ORDER — FENTANYL-BUPIVACAINE-NACL 0.5-0.125-0.9 MG/250ML-% EP SOLN
EPIDURAL | Status: AC
  Filled 2020-11-02: qty 250

## 2020-11-02 MED ORDER — PENICILLIN G POT IN DEXTROSE 60000 UNIT/ML IV SOLN
3.0000 10*6.[IU] | INTRAVENOUS | Status: DC
  Administered 2020-11-02 (×3): 3 10*6.[IU] via INTRAVENOUS
  Filled 2020-11-02 (×3): qty 50

## 2020-11-02 MED ORDER — LACTATED RINGERS IV SOLN: INTRAVENOUS | Status: DC

## 2020-11-02 MED ORDER — EPHEDRINE 5 MG/ML INJ: 10.0000 mg | INTRAVENOUS | Status: DC | PRN

## 2020-11-02 MED ORDER — BENZOCAINE-MENTHOL 20-0.5 % EX AERO
1.0000 "application " | INHALATION_SPRAY | CUTANEOUS | Status: DC | PRN
  Administered 2020-11-02: 1 via TOPICAL
  Filled 2020-11-02: qty 56

## 2020-11-02 MED ORDER — PRENATAL MULTIVITAMIN CH
1.0000 | ORAL_TABLET | Freq: Every day | ORAL | Status: DC
  Administered 2020-11-03: 1 via ORAL
  Filled 2020-11-02: qty 1

## 2020-11-02 MED ORDER — SODIUM CHLORIDE 0.9 % IV SOLN
5.0000 10*6.[IU] | Freq: Once | INTRAVENOUS | Status: AC
  Administered 2020-11-02: 5 10*6.[IU] via INTRAVENOUS
  Filled 2020-11-02: qty 5

## 2020-11-02 MED ORDER — HYDROXYZINE HCL 50 MG PO TABS: 50.0000 mg | ORAL_TABLET | Freq: Four times a day (QID) | ORAL | Status: DC | PRN

## 2020-11-02 MED ORDER — LACTATED RINGERS IV SOLN: 500.0000 mL | Freq: Once | INTRAVENOUS | Status: DC

## 2020-11-02 MED ORDER — OXYCODONE-ACETAMINOPHEN 5-325 MG PO TABS
2.0000 | ORAL_TABLET | ORAL | Status: DC | PRN
Start: 2020-11-02 — End: 2020-11-02

## 2020-11-02 MED ORDER — ONDANSETRON HCL 4 MG/2ML IJ SOLN
4.0000 mg | Freq: Four times a day (QID) | INTRAMUSCULAR | Status: DC | PRN
  Administered 2020-11-02: 4 mg via INTRAVENOUS
  Filled 2020-11-02: qty 2

## 2020-11-02 MED ORDER — MEASLES, MUMPS & RUBELLA VAC IJ SOLR: 0.5000 mL | Freq: Once | INTRAMUSCULAR | Status: DC

## 2020-11-02 MED ORDER — DIBUCAINE (PERIANAL) 1 % EX OINT: 1.0000 "application " | TOPICAL_OINTMENT | CUTANEOUS | Status: DC | PRN

## 2020-11-02 NOTE — Progress Notes (Signed)
Labor Progress Note [name] is a 25 y.o. G2P1001 at [redacted]w[redacted]d presented for IOL-IUGR (8%ile). S: Strip reviewed.  O:  BP (!) 108/57 (BP Location: Left Arm)   Pulse (!) 102   Temp 97.8 F (36.6 C) (Oral)   Resp 16   Ht 5\' 1"  (1.549 m)   Wt 79.4 kg   LMP  (LMP Unknown) Comment: ? May or June  BMI 33.07 kg/m  EFM: baseline 120bpm/mod variability/+ accels/no decels Toco: q1-3 min  CVE: Dilation: 5.5 Effacement (%): 90 Station: -2 Presentation: Vertex Exam by:: [email or web address], RN   A&P: 25 y.o. G2P1001 [redacted]w[redacted]d presented for IOL-IUGR (8%ile). #IOL: S/p cyto x1 and FB. Pitocin started at 0523, continue to titrate. #Pain: PRN #FWB: cat 1 #GBS positive   [redacted]w[redacted]d, MD 5:40 AM

## 2020-11-02 NOTE — Lactation Note (Signed)
This note was copied from a baby's chart. Lactation Consultation Note  Patient Name: Dana Oneal [id number] Date: 11/02/2020 Reason for consult: Initial assessment;Mother's request;Difficult latch;Term Age: 23 hrs   On arrival Dad just fed infant formula. Mom set up on a manual pump, she does not have a pump at home.  LC reviewed with Mom feeding cues, STS and different positions to get a deep latch.   Mom's nipples are erect. With pre pumping Mom showing drops of colostrum. Mom aware she can do hand expression and offer drops of colostrum via spoon feeding if unable to get baby to latch.   Plan 1. To feed based on cues 8-12x in 24 hour period no more than 4 hrs without an attempt. Mom to offer both breasts, look for signs to milk transfer and swallows during feeding.          2. Dad to paced bottle feed EBM or formula using yellow slow flow nipple.           3. Mom to pump manual q 3hrs for 10 minutes each breasts          4. I and O sheet reviewed           5. [facility] brochure of inpatient and outpatient services reviewed.  Parents are aware if infant not transferring from the breasts then can offer more supplementation of EBM or formula.  Breastfeeding supplementation guide reviewed with parents.  RN came in to do a vitals assessment and will assist infant with latching since she is active and cueing.   Maternal Data Has patient been taught Hand Expression?: Yes  Feeding Mother's Current Feeding Choice: Breast Milk and Formula  LATCH Score                    Lactation Tools Discussed/Used Tools: Pump;Flanges Flange Size: 24 Breast pump type: Manual Pump Education: Setup, frequency, and cleaning;Milk Storage Reason for Pumping: increase stimulation Pumping frequency: every 3 hrs for 15 mintues  Interventions Interventions: Breast feeding basics reviewed;Breast compression;Assisted with latch;Support pillows;Skin to skin;Position options;Breast massage;Hand  express;Expressed milk;Education;Hand pump  Discharge    Consult Status Consult Status: Follow-up Date: 11/03/20 Follow-up type: In-patient    Manette Doto  Nicholson-Springer 11/02/2020, 11:06 PM

## 2020-11-02 NOTE — Anesthesia Preprocedure Evaluation (Signed)
Anesthesia Evaluation  Patient identified by MRN, date of birth, ID band Patient awake    Reviewed: Allergy & Precautions, Patient's Chart, lab work & pertinent test results  Airway Mallampati: I  TM Distance: >3 FB Neck ROM: Full    Dental   Pulmonary asthma ,    breath sounds clear to auscultation       Cardiovascular negative cardio ROS   Rhythm:Regular Rate:Normal     Neuro/Psych  Headaches, negative psych ROS   GI/Hepatic negative GI ROS, Neg liver ROS,   Endo/Other  negative endocrine ROS  Renal/GU negative Renal ROS  negative genitourinary   Musculoskeletal negative musculoskeletal ROS (+)   Abdominal   Peds  Hematology  (+) anemia ,   Anesthesia Other Findings   Reproductive/Obstetrics (+) Pregnancy                             Lab Results  Component Value Date   WBC 8.4 11/02/2020   HGB 11.3 (L) 11/02/2020   HCT 35.8 (L) 11/02/2020   MCV 81.0 11/02/2020   PLT 214 11/02/2020     Anesthesia Physical  Anesthesia Plan  ASA: II  Anesthesia Plan: Epidural   Post-op Pain Management:    Induction:   PONV Risk Score and Plan:   Airway Management Planned:   Additional Equipment:   Intra-op Plan:   Post-operative Plan:   Informed Consent: I have reviewed the patients History and Physical, chart, labs and discussed the procedure including the risks, benefits and alternatives for the proposed anesthesia with the patient or authorized representative who has indicated his/her understanding and acceptance.       Plan Discussed with: Anesthesiologist  Anesthesia Plan Comments:         Anesthesia Quick Evaluation

## 2020-11-02 NOTE — Discharge Summary (Signed)
Postpartum Discharge Summary       Patient Name: Dana Oneal DOB: 09/18/1995 MRN: [id number]  Date of admission: 11/02/2020 Delivery date:11/02/2020  Delivering provider: Nicolette Bang  Date of discharge: 11/03/2020  Admitting diagnosis: IUGR (intrauterine growth restriction) affecting care of mother, third trimester, fetus 1 [O36.5931] Intrauterine pregnancy: [redacted]w[redacted]d     Secondary diagnosis:  Active Problems:   Encounter for induction of labor   IUGR (intrauterine growth restriction) affecting care of mother, third trimester, fetus 1  Additional problems: None     Discharge diagnosis: Term Pregnancy Delivered                                              Post partum procedures:varivax Augmentation: AROM, Pitocin, Cytotec and IP Foley Complications: None  Hospital course: Induction of Labor With Vaginal Delivery   25 y.o. yo G2P1001 at [redacted]w[redacted]d was admitted to the hospital 11/02/2020 for induction of labor.  Indication for induction: IUGR.  Patient had an uncomplicated labor course as follows: Membrane Rupture Time/Date: 1:51 PM ,11/02/2020   Delivery Method:Vaginal, Spontaneous  Episiotomy: None  Lacerations:  None  Details of delivery can be found in separate delivery note.  Patient's postpartum course complicated by urinary retention. She required 3 I&O cath's after delivery. By PPD#1 patient was voiding independently without issue . Patient is discharged home 11/03/20.  Newborn Data: Birth date:11/02/2020  Birth time:3:03 PM  Gender:Female  Living status:Living  Apgars:8 ,9  Weight:2750 g   Magnesium Sulfate received: No BMZ received: No Rhophylac:N/A MMR:N/A T-DaP:Given prenatally Flu: Yes Transfusion:No  Physical exam  Vitals:   11/02/20 2150 11/03/20 0129 11/03/20 0600 11/03/20 1645  BP:  100/66 (!) 94/52 100/63  Pulse: 76 (!) 58 72   Resp: $Remo'18 20 18 16  'NAuZV$ Temp: 98.2 F (36.8 C) 97.7 F (36.5 C) 97.6 F (36.4 C) 98 F (36.7 C)  TempSrc: Oral Oral Oral    SpO2: 100% 98% 97%   Weight:      Height:       General: alert, cooperative and no distress Lochia: appropriate Uterine Fundus: firm Incision: N/A DVT Evaluation: No evidence of DVT seen on physical exam. Labs: Lab Results  Component Value Date   WBC 8.4 11/02/2020   HGB 11.3 (L) 11/02/2020   HCT 35.8 (L) 11/02/2020   MCV 81.0 11/02/2020   PLT 214 11/02/2020   CMP Latest Ref Rng & Units 03/19/2020  Glucose 70 - 99 mg/dL 72  BUN 6 - 20 mg/dL 5(L)  Creatinine 0.44 - 1.00 mg/dL 0.58  Sodium 135 - 145 mmol/L 135  Potassium 3.5 - 5.1 mmol/L 3.6  Chloride 98 - 111 mmol/L 104  CO2 22 - 32 mmol/L 20(L)  Calcium 8.9 - 10.3 mg/dL 9.2  Total Protein 6.5 - 8.1 g/dL 7.3  Total Bilirubin 0.3 - 1.2 mg/dL 0.3  Alkaline Phos 38 - 126 U/L 52  AST 15 - 41 U/L 21  ALT 0 - 44 U/L 17   Edinburgh Score: Edinburgh Postnatal Depression Scale Screening Tool 11/03/2020  I have been able to laugh and see the funny side of things. 0  I have looked forward with enjoyment to things. 0  I have blamed myself unnecessarily when things went wrong. 1  I have been anxious or worried for no good reason. 0  I have felt scared or panicky for  no good reason. 0  Things have been getting on top of me. 1  I have been so unhappy that I have had difficulty sleeping. 0  I have felt sad or miserable. 0  I have been so unhappy that I have been crying. 0  The thought of harming myself has occurred to me. 0  Edinburgh Postnatal Depression Scale Total 2     After visit meds:  Allergies as of 11/03/2020   No Known Allergies     Medication List    TAKE these medications   acetaminophen 325 MG tablet Commonly known as: Tylenol Take 2 tablets (650 mg total) by mouth every 4 (four) hours as needed (for pain scale < 4).   ferrous sulfate 325 (65 FE) MG tablet Take 325 mg by mouth every other day.   ibuprofen 600 MG tablet Commonly known as: ADVIL Take 1 tablet (600 mg total) by mouth every 6 (six) hours.    prenatal multivitamin Tabs tablet Take 1 tablet by mouth daily at 12 noon.        Discharge home in stable condition Infant Feeding: Bottle and Breast Infant Disposition:home with mother Discharge instruction: per After Visit Summary and Postpartum booklet. Activity: Advance as tolerated. Pelvic rest for 6 weeks.  Diet: routine diet Future Appointments:No future appointments. Follow up Visit:   Please schedule this patient for a Virtual postpartum visit in 4 weeks with the following provider: Any provider. Additional Postpartum F/U:None  High risk pregnancy complicated by: IUGR Delivery mode:  Vaginal, Spontaneous  Anticipated Birth Control:  Condoms   11/03/2020 Janet Berlin, MD

## 2020-11-02 NOTE — Anesthesia Procedure Notes (Signed)
Epidural Patient location during procedure: OB Start time: 11/02/2020 11:07 AM End time: 11/02/2020 11:17 AM  Staffing Anesthesiologist: Mellody Dance, MD Performed: anesthesiologist   Preanesthetic Checklist Completed: patient identified, IV checked, site marked, risks and benefits discussed, monitors and equipment checked, pre-op evaluation and timeout performed  Epidural Patient position: sitting Prep: DuraPrep Patient monitoring: heart rate, cardiac monitor, continuous pulse ox and blood pressure Approach: midline Location: L3-L4 Injection technique: LOR saline  Needle:  Needle type: Tuohy  Needle gauge: 17 G Needle length: 9 cm Needle insertion depth: 6 cm Catheter type: closed end flexible Catheter size: 20 Guage Catheter at skin depth: 11 cm Test dose: negative and Other  Assessment Events: blood not aspirated, injection not painful, no injection resistance and negative IV test  Additional Notes Informed consent obtained prior to proceeding including risk of failure, 1% risk of PDPH, risk of minor discomfort and bruising.  Discussed rare but serious complications including epidural abscess, permanent nerve injury, epidural hematoma.  Discussed alternatives to epidural analgesia and patient desires to proceed.  Timeout performed pre-procedure verifying patient name, procedure, and platelet count.  Patient tolerated procedure well.

## 2020-11-02 NOTE — H&P (Signed)
OBSTETRIC ADMISSION HISTORY AND PHYSICAL  Ikran Linzey Ramser is a 25 y.o. female G2P1001 with IUP at [redacted]w[redacted]d by early u/s presenting for IOL-IUGR (8%ile). She reports +FMs, No LOF, no VB, no blurry vision, headaches or peripheral edema, and RUQ pain.  She plans on breast and bottle feeding. She request condoms for birth control. She received her prenatal care at [facility]   Dating: By early u/s --->  Estimated Date of Delivery: 11/06/20  Sono: no report available on file, IUGR 8%ile per progress note 11/01/20   Prenatal History/Complications:  IUGR (8%ile) GBS positive BMI 33 History of VAVD Varicella non-immune  Past Medical History: Past Medical History:  Diagnosis Date  . Asthma    last inhaler use "in middle school"  . Headache     Past Surgical History: Past Surgical History:  Procedure Laterality Date  . NO PAST SURGERIES      Obstetrical History: OB History    Gravida  2   Para  1   Term  1   Preterm      AB      Living  1     SAB      IAB      Ectopic      Multiple  0   Live Births  1           Social History Social History   Socioeconomic History  . Marital status: Single    Spouse name: Not on file  . Number of children: Not on file  . Years of education: Not on file  . Highest education level: Not on file  Occupational History  . Not on file  Tobacco Use  . Smoking status: Passive Smoke Exposure - Never Smoker  . Smokeless tobacco: Never Used  Vaping Use  . Vaping Use: Never used  Substance and Sexual Activity  . Alcohol use: No  . Drug use: No  . Sexual activity: Yes    Birth control/protection: None  Other Topics Concern  . Not on file  Social History Narrative  . Not on file   Social Determinants of Health   Financial Resource Strain: Not on file  Food Insecurity: Not on file  Transportation Needs: Not on file  Physical Activity: Not on file  Stress: Not on file  Social Connections: Not on file    Family  History: Family History  Problem Relation Age of Onset  . Healthy Father     Allergies: No Known Allergies  Medications Prior to Admission  Medication Sig Dispense Refill Last Dose  . Prenatal Vit w/Fe-Methylfol-FA (PNV PO) Take 1 tablet daily by mouth.         Review of Systems   All systems reviewed and negative except as stated in HPI  Blood pressure (!) 108/57, pulse (!) 102, temperature 98.1 F (36.7 C), temperature source Oral, resp. rate 16, height 5\' 1"  (1.549 m), weight 79.4 kg, currently breastfeeding. General appearance: alert, cooperative and no distress Lungs: normal respiratory effort Heart: regular rate and rhythm Abdomen: soft, non-tender; gravid Pelvic: as noted below Extremities: Homans sign is negative, no sign of DVT Presentation: cephalic by cervical exam Fetal monitoringBaseline: 150 bpm, Variability: Good {> 6 bpm), Accelerations: Reactive and Decelerations: Absent Uterine activityNone Dilation: 1 Effacement (%): 50 Station: -2 Exam by:: Dr. [email or web address], MD   Prenatal labs: ABO, Rh: --/--/PENDING (03/08 0036)O pos Antibody: PENDING (03/08 0036)neg Rubella:  immune RPR:   NR HBsAg:   NR HIV:   NR GBS:  positive 1 hr Glucola passed Genetic screening  declined Anatomy US reportedly normal, no report available  Prenatal Transfer Tool  Maternal Diabetes: No Genetic Screening: Declined Maternal Ultrasounds/Referrals: IUGR Fetal Ultrasounds or other Referrals:  None Maternal Substance Abuse:  No Significant Maternal Medications:  None Significant Maternal Lab Results: Group B Strep positive  Results for orders placed or performed during the hospital encounter of 11/02/20 (from the past 24 hour(s))  Type and screen   Collection Time: 11/02/20 12:36 AM  Result Value Ref Range   ABO/RH(D) PENDING    Antibody Screen PENDING    Sample Expiration      11/05/2020,2359 Performed at [facility], [address] [place], [place] [address]      Patient Active Problem List   Diagnosis Date Noted  . IUGR (intrauterine growth restriction) affecting care of mother, third trimester, fetus 1 11/02/2020  . Encounter for induction of labor 07/13/2017    Assessment/Plan:  Marybelle Allye Hoyos is a 25 y.o. G2P1001 at [redacted]w[redacted]d here for IOL-IUGR (8%ile).  #IOL: Noted to be IUGR at ultrasound at [facility] 11/01/20 and was scheduled for IOL. RN administered cytotec 25 mcg vaginally. Discussed risks/benefits of FB and placed without difficulty.  #Pain: PRN #FWB: Cat 1 #ID: GBS pos, PCN #MOF: both #MOC: condoms   Alric Seton, MD  11/02/2020, 1:14 AM

## 2020-11-02 NOTE — Progress Notes (Signed)
Suzetta Ratasha Fabre is a 25 y.o. G2P1001 at [redacted]w[redacted]d by LMP admitted for induction of labor due to IUGR.  Subjective: Patient doing well, comfortable in bed.   Objective: BP 117/69   Pulse (!) 58   Temp 98.7 F (37.1 C) (Oral)   Resp 16   Ht 5\' 1"  (1.549 m)   Wt 79.4 kg   LMP  (LMP Unknown) Comment: ? May or June  BMI 33.07 kg/m  I/O last 3 completed shifts: In: 586.5 [P.O.:30; I.V.:556.5] Out: -  Total I/O In: 1671 [I.V.:1499.2; Other:21.8; IV Piggyback:150] Out: -   FHT:  FHR: 135 bpm, variability: moderate,  accelerations:  Present,  decelerations:  Absent UC:   none SVE:   Dilation: 7 Effacement (%): 90 Station: 0 Exam by:: [email or web address], RN  Labs: Lab Results  Component Value Date   WBC 8.4 11/02/2020   HGB 11.3 (L) 11/02/2020   HCT 35.8 (L) 11/02/2020   MCV 81.0 11/02/2020   PLT 214 11/02/2020    Assessment / Plan: Induction of labor due to IUGR,  progressing well on pitocin  Labor: Progressing normally, AROM at 1400, clear fluid Preeclampsia:  labs stable Fetal Wellbeing:  Category I Pain Control:  Epidural I/D:  GBS+, penicillin given Anticipated MOD:  NSVD  01/02/2021 Baileigh Modisette 11/02/2020, 2:03 PM

## 2020-11-02 NOTE — Lactation Note (Signed)
This note was copied from a baby's chart. Lactation Consultation Note  Patient Name: Dana Oneal TDVVO'H Date: 11/02/2020 Reason for consult: L&D Initial assessment;Mother's request;Term Age:23 hours  LC assisted with latching. Infant latched with few sucks before getting tired. Parents shown how to hand express and offer drops of colostrum on the floor.  LC provide further assistance on the floor.   Maternal Data Has patient been taught Hand Expression?: Yes Does the patient have breastfeeding experience prior to this delivery?: Yes (first 2 to 3 months breast/bottle fed infant stopped latching.) How long did the patient breastfeed?: first 2 to 3 months breast/bottle fed infant stopped latching.  Feeding Mother's Current Feeding Choice: Breast Milk and Formula  LATCH Score Latch: Repeated attempts needed to sustain latch, nipple held in mouth throughout feeding, stimulation needed to elicit sucking reflex.  Audible Swallowing: A few with stimulation  Type of Nipple: Everted at rest and after stimulation  Comfort (Breast/Nipple): Soft / non-tender  Hold (Positioning): Assistance needed to correctly position infant at breast and maintain latch.  LATCH Score: 7   Lactation Tools Discussed/Used    Interventions Interventions: Breast feeding basics reviewed;Breast compression;Assisted with latch;Adjust position;Hand express;Expressed milk;Education  Discharge    Consult Status Consult Status: Follow-up Date: 11/03/20 Follow-up type: In-patient    Rowena Moilanen  Nicholson-Springer 11/02/2020, 3:53 PM

## 2020-11-03 MED ORDER — IBUPROFEN 600 MG PO TABS: 600.0000 mg | ORAL_TABLET | Freq: Four times a day (QID) | ORAL | 0 refills | Status: AC

## 2020-11-03 MED ORDER — ACETAMINOPHEN 325 MG PO TABS: 650.0000 mg | ORAL_TABLET | ORAL | Status: AC | PRN

## 2020-11-03 NOTE — Progress Notes (Addendum)
Post Partum Day 1 Subjective: no complaints and has not spontaneously voided, no flatus yet  Objective: Blood pressure (!) 94/52, pulse 72, temperature 97.6 F (36.4 C), temperature source Oral, resp. rate 18, height 5\' 1"  (1.549 m), weight 79.4 kg, SpO2 97 %, currently breastfeeding.  Physical Exam:  General: alert and cooperative Lochia: appropriate Uterine Fundus: firm Incision: healing well DVT Evaluation: No evidence of DVT seen on physical exam. No significant calf/ankle edema.  Recent Labs    11/02/20 0006  HGB 11.3*  HCT 35.8*    Assessment/Plan: Plan for discharge tomorrow and Contraception with condoms  -meeting pospartum milestones, VSS -breast and bottle feeding, going well  #Urinary retention: in and out cath x3 yielding ~3L without spontaneous void  #Varicella NI: varicella vaccine ordered  Patient desires discharge later today if spontaneously voiding.   LOS: 1 day   01/02/21 11/03/2020, 7:40 AM   GME ATTESTATION:  I saw and evaluated the patient. I agree with the findings and the plan of care as documented in the resident's note.  01/03/2021, MD OB Fellow, Faculty [facility] 11/03/2020 7:47 AM

## 2020-11-03 NOTE — Anesthesia Postprocedure Evaluation (Signed)
Anesthesia Post Note  Patient: Epifania Gore  Procedure(s) Performed: AN AD HOC LABOR EPIDURAL     Patient location during evaluation: Mother Baby Anesthesia Type: Epidural Level of consciousness: awake and alert Pain management: pain level controlled Vital Signs Assessment: post-procedure vital signs reviewed and stable Respiratory status: spontaneous breathing, nonlabored ventilation and respiratory function stable Cardiovascular status: stable Postop Assessment: no headache, no backache and epidural receding Anesthetic complications: no   No complications documented.  Last Vitals:  Vitals:   11/03/20 0129 11/03/20 0600  BP: 100/66 (!) 94/52  Pulse: (!) 58 72  Resp: 20 18  Temp: 36.5 C 36.4 C  SpO2: 98% 97%    Last Pain:  Vitals:   11/03/20 0600  TempSrc: Oral  PainSc: 1    Pain Goal:                   Arletta Lumadue

## 2020-11-03 NOTE — Lactation Note (Addendum)
This note was copied from a baby's chart. Lactation Consultation Note  Patient Name: Dana Oneal Date: 11/03/2020 Reason for consult: Follow-up assessment;Term;Infant weight loss;Other (Comment) (3 % weight loss) Age:23 hours  During this consult latched x 2 , 1st  Latch - baby easily latched on and sustained latch for 5 mins with swallows / STS. 2nd Latch - LC added the 5 F SNS after the latch and baby fed for 8 mins and took 3 ml from the SNS and tolerated well.  LC reviewed and updated the doc flow sheets per dad. Two stools from 11-7 am and wet at consult. Baby is only at 3 % weight loss, has had formula for majority of the feedings and did not act overly hungry.  LC was impressed how the baby latched and how she picked up her feeding pattern to be more nutritive with the SNS.  Per mom prefers to use the hand pump and is willing to pre - pump prior to every feeding to prime the milk ducts.  Mom able to latch her baby well after instruction.  LC provided the [facility] brochure with resource numbers.  LC called Lauren Rafeek regarding the out come of the [facility] consult./ questionable D/C.   Maternal Data Has patient been taught Hand Expression?: Yes  Feeding Mother's Current Feeding Choice: Breast Milk and Formula Nipple Type: Slow - flow  LATCH Score Latch: Grasps breast easily, tongue down, lips flanged, rhythmical sucking.  Audible Swallowing: Spontaneous and intermittent  Type of Nipple: Everted at rest and after stimulation  Comfort (Breast/Nipple): Soft / non-tender  Hold (Positioning): Assistance needed to correctly position infant at breast and maintain latch.  LATCH Score: 9   Lactation Tools Discussed/Used Tools: Pump Flange Size: 24;27 Breast pump type: Manual Pump Education: Milk Storage;Setup, frequency, and cleaning Reason for Pumping: LC recommended prior to every feeding until milk comes in after breast massage, hand express, pre - pump to prime the milk  ducts  Interventions Interventions: Breast feeding basics reviewed;Assisted with latch;Skin to skin;Breast massage;Hand express;Breast compression;Adjust position;Support pillows;Position options;Hand pump/ education completed.  LC showed dad how to help mom with the 5 F SNS after the baby is latched and cleaning it.   Discharge Discharge Education: Warning signs for feeding baby;Engorgement and breast care Pump: Manual WIC Program: Yes  Consult Status Consult Status: Complete Date: 11/03/20 Follow-up type: In-patient    Dana Oneal Dana Oneal 11/03/2020, 3:53 PM

## 2022-04-29 IMAGING — US US OB COMP LESS 14 WK
1 series · 15 of 28 positions shown · non-contrast
Comparison: None.

CLINICAL DATA: Pelvic cramping.

EXAM:
OBSTETRIC <14 WK ULTRASOUND
TECHNIQUE: Transabdominal ultrasound was performed for evaluation of the
gestation as well as the maternal uterus and adnexal regions.

[Series 1: us ob comp less 14 wk · 15 of 45 slices shown]
[im 1/45]
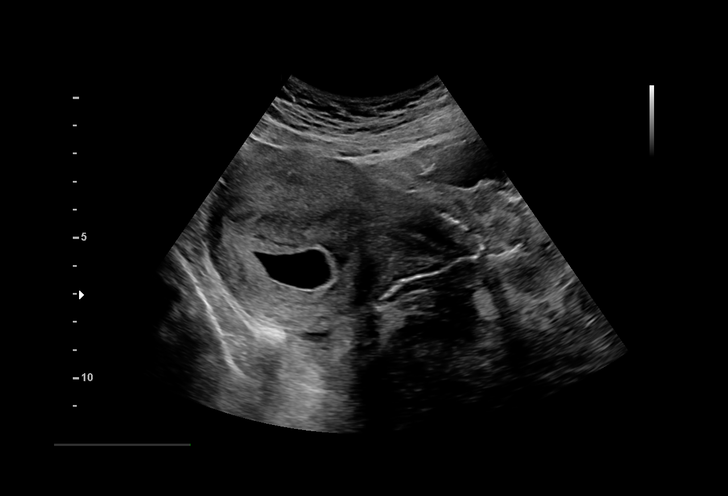
[im 4/45]
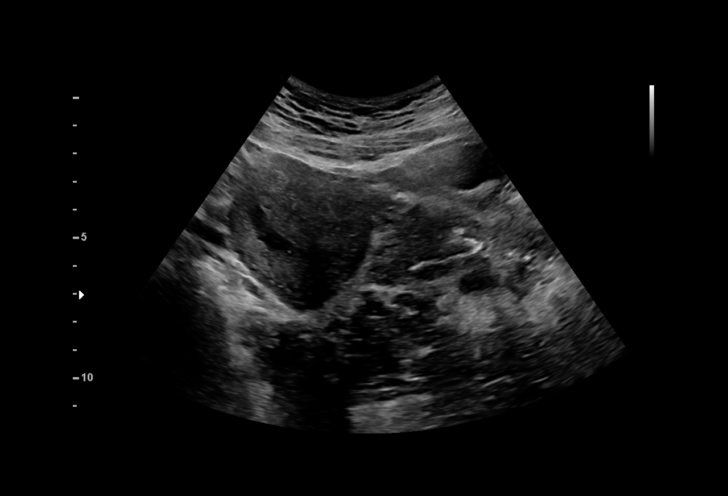
[im 7/45]
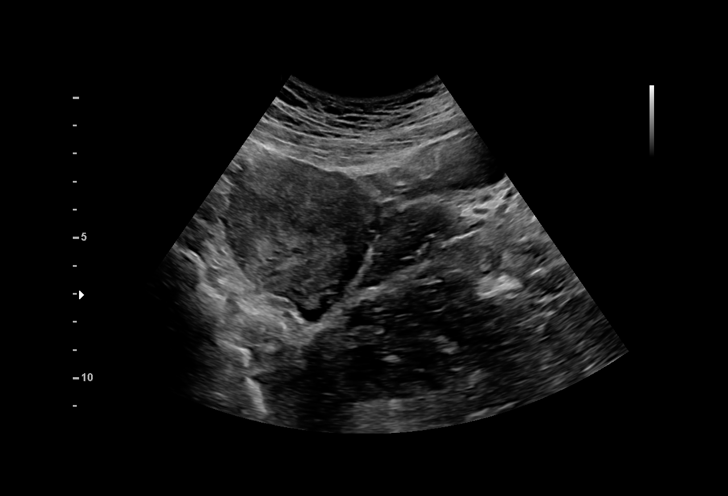
[im 10/45]
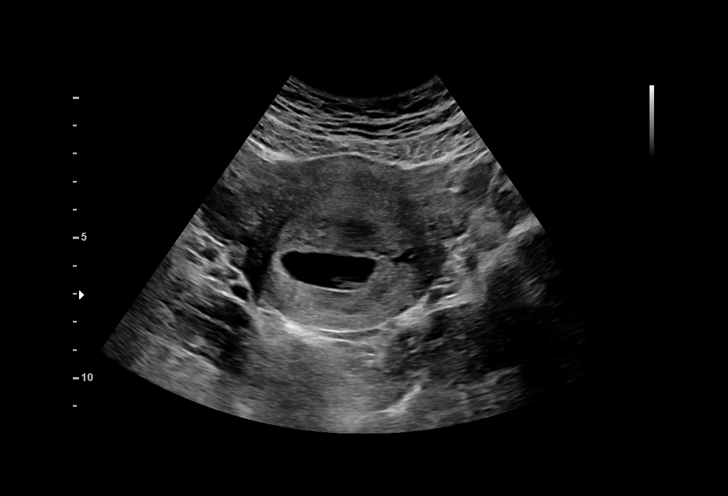
[im 14/45]
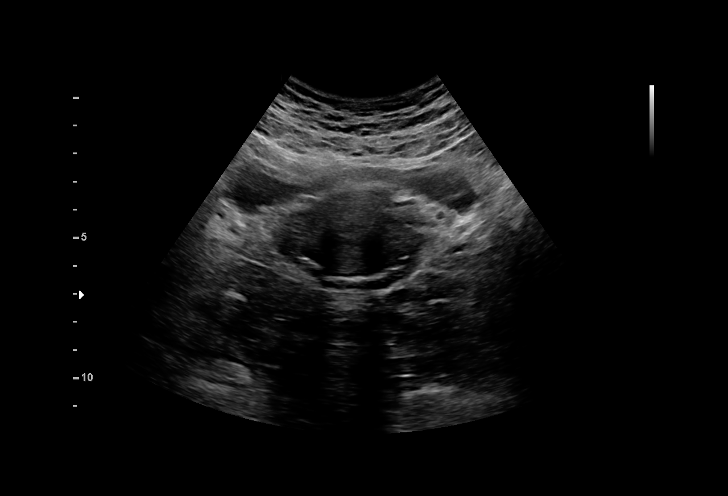
[im 17/45]
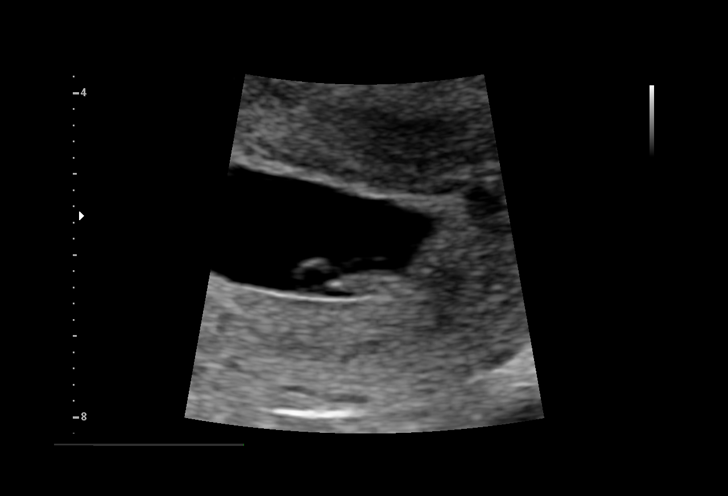
[im 20/45]
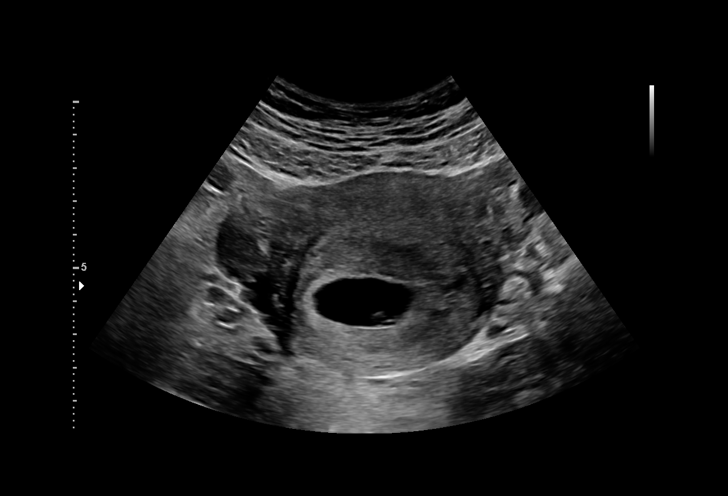
[im 23/45]
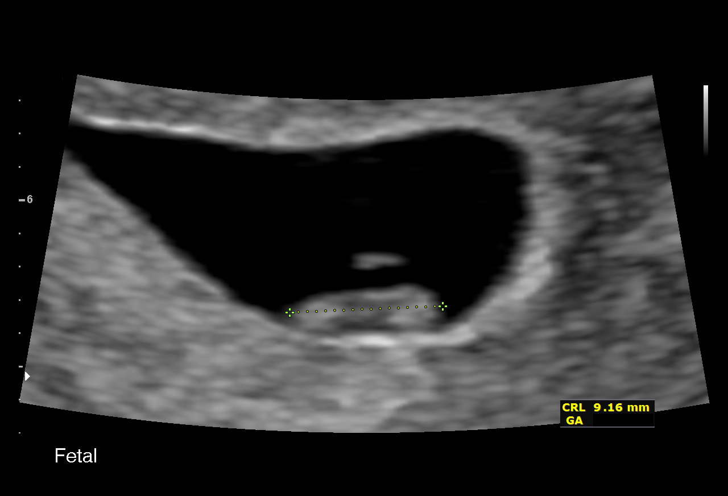
[im 25/45]
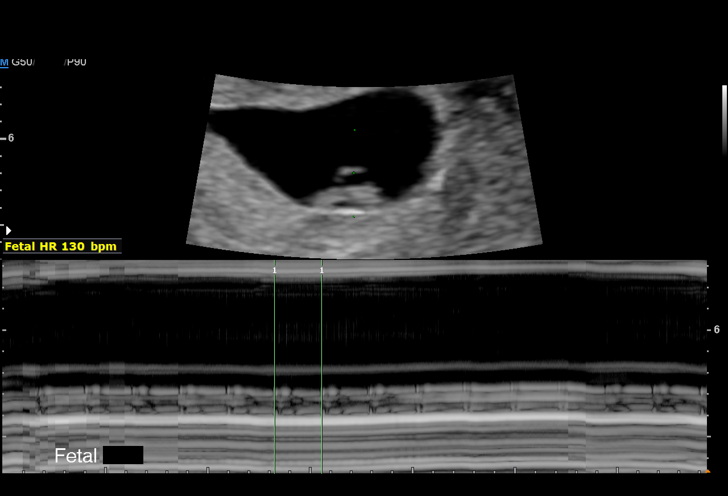
[im 28/45]
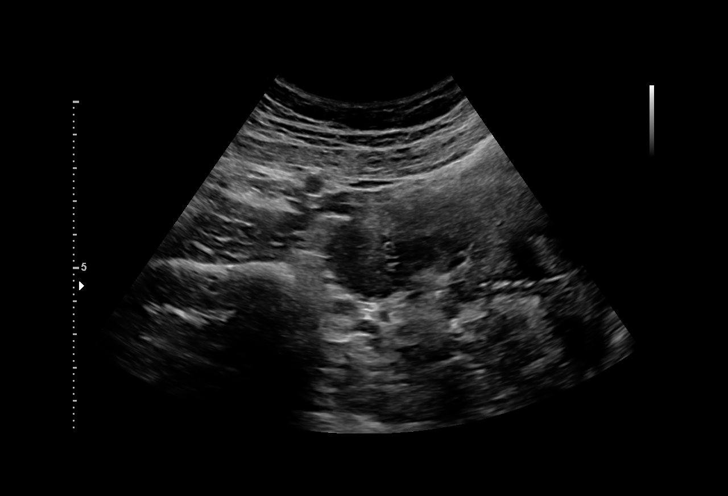
[im 31/45]
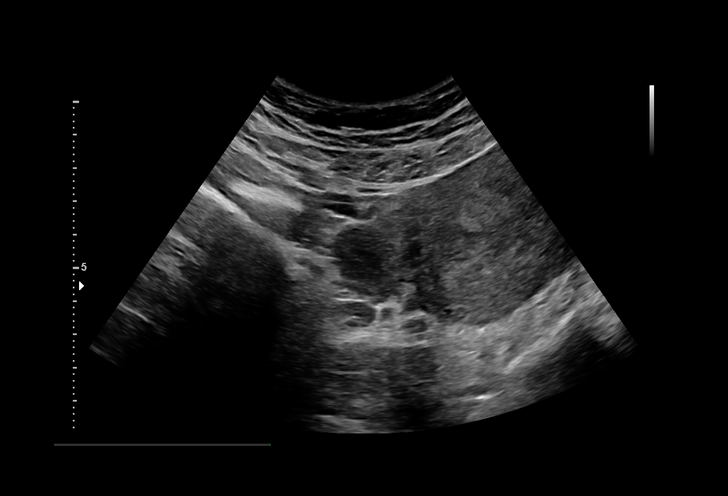
[im 35/45]
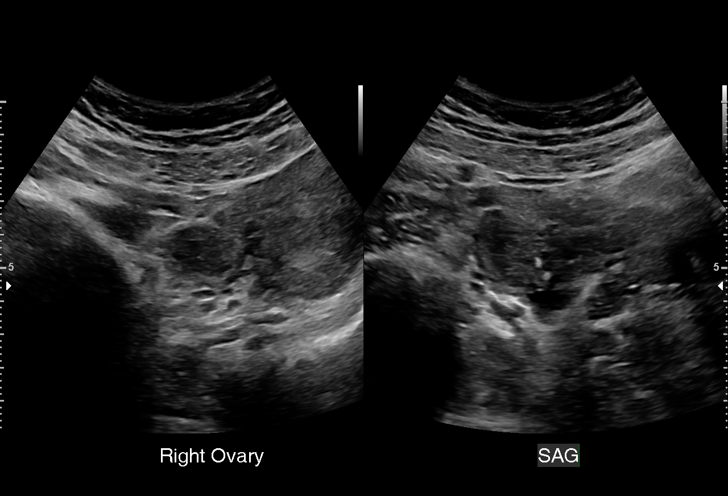
[im 38/45]
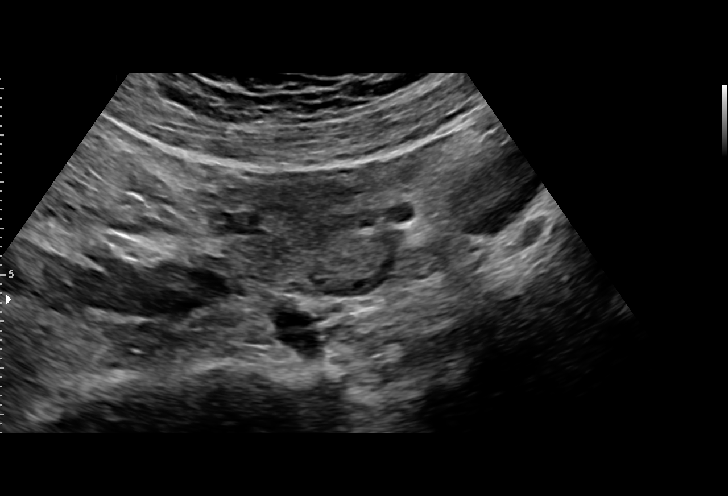
[im 41/45]
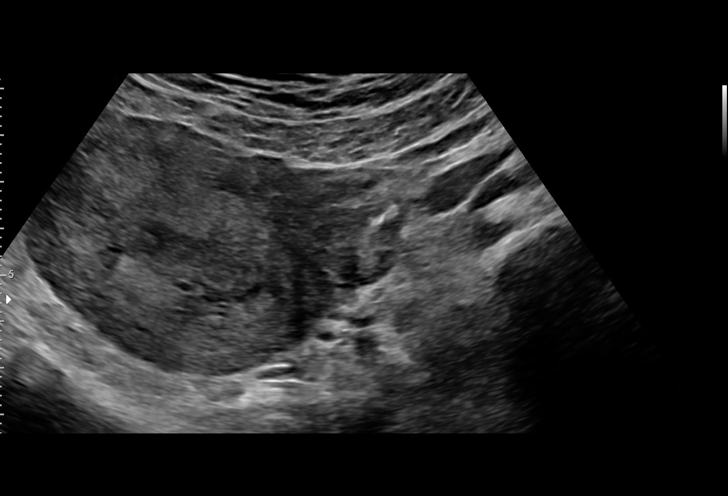
[im 45/45]
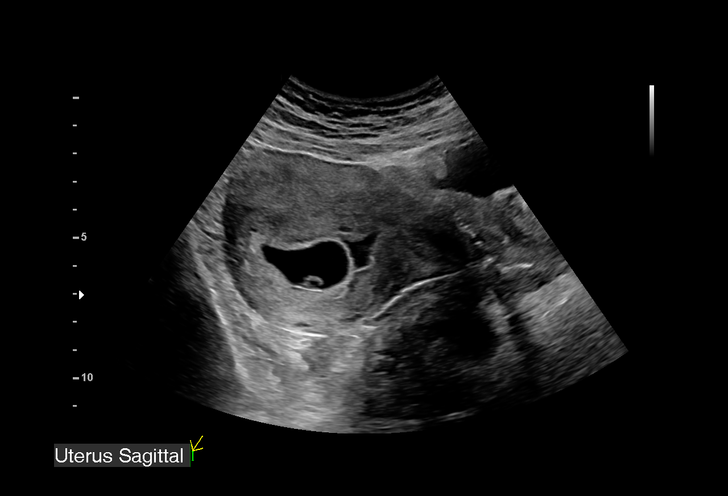

[15 of 28 positions shown; findings below may reference images not displayed]

FINDINGS: Intrauterine gestational sac: Single

Yolk sac:  Not Visualized.

Embryo:  Not Visualized.

Cardiac Activity: Not Visualized.

Heart Rate: 130 bpm

CRL:   9.1 mm   6 w 6 d                  US EDC: November 06, 2020

Subchorionic hemorrhage:  A small subchorionic hemorrhage is noted.

Maternal uterus/adnexae: The right and left ovaries are visualized
and are normal in appearance.

No pelvic free fluid is seen.
IMPRESSION: 1. Live, single intrauterine pregnancy at approximately 6 weeks and
6 days gestation by ultrasound evaluation.
2. Small subchorionic hemorrhage.
